# Patient Record
Sex: Female | Born: 1985 | Race: White | Hispanic: No | Marital: Married | Smoking: Never smoker
Health system: Southern US, Community
[De-identification: ages and names within clinical notes are randomized; demographics above are authoritative.]

## PROBLEM LIST (undated history)

## (undated) DIAGNOSIS — J8283 Eosinophilic asthma: Secondary | ICD-10-CM

## (undated) DIAGNOSIS — O24419 Gestational diabetes mellitus in pregnancy, unspecified control: Secondary | ICD-10-CM

## (undated) DIAGNOSIS — Z1371 Encounter for nonprocreative screening for genetic disease carrier status: Secondary | ICD-10-CM

## (undated) HISTORY — DX: Gestational diabetes mellitus in pregnancy, unspecified control: O24.419

## (undated) HISTORY — DX: Encounter for nonprocreative screening for genetic disease carrier status: Z13.71

## (undated) HISTORY — PX: COLPOSCOPY: SHX161

## (undated) HISTORY — DX: Eosinophilic asthma: J82.83

---

## 2008-09-01 ENCOUNTER — Ambulatory Visit: Payer: Self-pay | Admitting: Internal Medicine

## 2011-07-09 ENCOUNTER — Ambulatory Visit: Payer: Self-pay | Admitting: Internal Medicine

## 2011-11-30 ENCOUNTER — Ambulatory Visit: Payer: Self-pay | Admitting: Gastroenterology

## 2011-11-30 LAB — PREGNANCY, URINE: Pregnancy Test, Urine: NEGATIVE m[IU]/mL

## 2012-08-10 DIAGNOSIS — Z1371 Encounter for nonprocreative screening for genetic disease carrier status: Secondary | ICD-10-CM

## 2012-08-10 HISTORY — DX: Encounter for nonprocreative screening for genetic disease carrier status: Z13.71

## 2016-08-10 HISTORY — PX: COLPOSCOPY: SHX161

## 2017-08-12 ENCOUNTER — Encounter: Payer: Self-pay | Admitting: Obstetrics and Gynecology

## 2017-08-12 ENCOUNTER — Ambulatory Visit (INDEPENDENT_AMBULATORY_CARE_PROVIDER_SITE_OTHER): Payer: BC Managed Care – PPO | Admitting: Obstetrics and Gynecology

## 2017-08-12 ENCOUNTER — Other Ambulatory Visit: Payer: Self-pay

## 2017-08-12 VITALS — BP 110/76 | HR 76 | Ht 64.0 in | Wt 140.0 lb

## 2017-08-12 DIAGNOSIS — Z3041 Encounter for surveillance of contraceptive pills: Secondary | ICD-10-CM

## 2017-08-12 DIAGNOSIS — Z01419 Encounter for gynecological examination (general) (routine) without abnormal findings: Secondary | ICD-10-CM

## 2017-08-12 DIAGNOSIS — Z124 Encounter for screening for malignant neoplasm of cervix: Secondary | ICD-10-CM

## 2017-08-12 DIAGNOSIS — Z1151 Encounter for screening for human papillomavirus (HPV): Secondary | ICD-10-CM

## 2017-08-12 MED ORDER — DROSPIRENONE-ETHINYL ESTRADIOL 3-0.02 MG PO TABS: 1.0000 | ORAL_TABLET | Freq: Every day | ORAL | 4 refills | Status: DC

## 2017-08-12 NOTE — Patient Instructions (Signed)
I value your feedback and entrusting us with your care. If you get a [facility] patient survey, I would appreciate you taking the time to let us know about your experience today. Thank you! 

## 2017-08-12 NOTE — Progress Notes (Signed)
PCP:  Patient, No Pcp Per   Chief Complaint  Patient presents with  . Gynecologic Exam    No complaints     HPI:      [name] is a 32 y.o. G0P0000 who LMP was No LMP recorded. Patient is not currently having periods (Reason: Oral contraceptives)., presents today for her annual examination.  Her menses are absent due to continuous dosing of OCPs.  Dysmenorrhea mild, occurring throughout cycle. She does not have intermenstrual bleeding.  Sex activity: single partner, contraception - OCP (estrogen/progesterone).  Last Pap: August 11, 2016  Results were: ASCUS/neg HPV DNA Hx of STDs: HPV  There is a  FH of breast cancer in her MGM and PGM. Pt had neg BRCA testing in 2014 but then discovered she doesn't qualify for genetic testing due to true age of dx of breast cancer in grandmother. There is no FH of ovarian cancer. The patient does do self-breast exams.  Tobacco use: The patient denies current or previous tobacco use. Alcohol use: social drinker No drug use.  Exercise: not active  She does get adequate calcium and Vitamin D in her diet.   Past Medical History:  Diagnosis Date  . BRCA negative 2014   BRCA neg    Past Surgical History:  Procedure Laterality Date  . COLPOSCOPY      Family History  Problem Relation Age of Onset  . Hypertension Father   . Breast cancer Maternal Grandmother 60  . Hypertension Maternal Grandmother   . Hypertension Maternal Grandfather   . Breast cancer Paternal Grandmother 23  . Hypertension Paternal Grandmother   . Lung cancer Paternal Grandfather 54  . Hypertension Paternal Grandfather     Social History   Socioeconomic History  . Marital status: Single    Spouse name: Not on file  . Number of children: Not on file  . Years of education: Not on file  . Highest education level: Not on file  Social Needs  . Financial resource strain: Not on file  . Food insecurity - worry: Not on file  . Food insecurity -  inability: Not on file  . Transportation needs - medical: Not on file  . Transportation needs - non-medical: Not on file  Occupational History  . Not on file  Tobacco Use  . Smoking status: Never Smoker  . Smokeless tobacco: Never Used  Substance and Sexual Activity  . Alcohol use: Yes    Alcohol/week: 1.2 oz    Types: 1 Glasses of wine, 1 Cans of beer per week  . Drug use: No  . Sexual activity: Yes    Partners: Male    Birth control/protection: Pill  Other Topics Concern  . Not on file  Social History Narrative  . Not on file    Current Meds  Medication Sig  . drospirenone-ethinyl estradiol (NIKKI) 3-0.02 MG tablet Take 1 tablet by mouth daily. CONTINUOUS DOSING  . [DISCONTINUED] drospirenone-ethinyl estradiol (NIKKI) 3-0.02 MG tablet      ROS:  Review of Systems  Constitutional: Negative for fatigue, fever and unexpected weight change.  Respiratory: Negative for cough, shortness of breath and wheezing.   Cardiovascular: Negative for chest pain, palpitations and leg swelling.  Gastrointestinal: Negative for blood in stool, constipation, diarrhea, nausea and vomiting.  Endocrine: Negative for cold intolerance, heat intolerance and polyuria.  Genitourinary: Negative for dyspareunia, dysuria, flank pain, frequency, genital sores, hematuria, menstrual problem, pelvic pain, urgency, vaginal bleeding, vaginal discharge and vaginal pain.  Musculoskeletal: Negative for back pain, joint swelling and myalgias.  Skin: Negative for rash.  Neurological: Negative for dizziness, syncope, light-headedness, numbness and headaches.  Hematological: Negative for adenopathy.  Psychiatric/Behavioral: Negative for agitation, confusion, sleep disturbance and suicidal ideas. The patient is not nervous/anxious.      Objective: BP 110/76 (BP Location: Left Arm, Patient Position: Sitting, Cuff Size: Normal)   Pulse 76   Ht 5' 4" (1.626 m)   Wt 140 lb (63.5 kg)   BMI 24.03 kg/m     Physical Exam  Constitutional: She is oriented to person, place, and time. She appears well-developed and well-nourished.  Genitourinary: Vagina normal and uterus normal. There is no rash or tenderness on the right labia. There is no rash or tenderness on the left labia. No erythema or tenderness in the vagina. No vaginal discharge found. Right adnexum does not display mass and does not display tenderness. Left adnexum does not display mass and does not display tenderness. Cervix does not exhibit motion tenderness or polyp. Uterus is not enlarged or tender.  Neck: Normal range of motion. No thyromegaly present.  Cardiovascular: Normal rate, regular rhythm and normal heart sounds.  No murmur heard. Pulmonary/Chest: Effort normal and breath sounds normal. Right breast exhibits no mass, no nipple discharge, no skin change and no tenderness. Left breast exhibits no mass, no nipple discharge, no skin change and no tenderness.  Abdominal: Soft. There is no tenderness. There is no guarding.  Musculoskeletal: Normal range of motion.  Neurological: She is alert and oriented to person, place, and time. No cranial nerve deficit.  Psychiatric: She has a normal mood and affect. Her behavior is normal.  Vitals reviewed.   Assessment/Plan: Encounter for annual routine gynecological examination  Cervical cancer screening - Plan: IGP, Aptima HPV  Screening for HPV (human papillomavirus) - Plan: IGP, Aptima HPV  Encounter for surveillance of contraceptive pills - OCP RF for cont dosing. - Plan: drospirenone-ethinyl estradiol (NIKKI) 3-0.02 MG tablet  Meds ordered this encounter  Medications  . drospirenone-ethinyl estradiol (NIKKI) 3-0.02 MG tablet    Sig: Take 1 tablet by mouth daily. CONTINUOUS DOSING    Dispense:  3 Package    Refill:  4             GYN counsel adequate intake of calcium and vitamin D, diet and exercise     F/U  Return in about 1 year (around 08/12/2018).  Alicia B.  Copland, PA-C 08/12/2017 2:19 PM 

## 2017-08-14 LAB — IGP, APTIMA HPV
HPV Aptima: NEGATIVE
PAP SMEAR COMMENT: 0

## 2018-08-15 ENCOUNTER — Encounter: Payer: Self-pay | Admitting: Obstetrics and Gynecology

## 2018-08-15 ENCOUNTER — Ambulatory Visit (INDEPENDENT_AMBULATORY_CARE_PROVIDER_SITE_OTHER): Payer: BC Managed Care – PPO | Admitting: Obstetrics and Gynecology

## 2018-08-15 VITALS — BP 102/72 | HR 82 | Ht 65.0 in | Wt 143.0 lb

## 2018-08-15 DIAGNOSIS — Z01419 Encounter for gynecological examination (general) (routine) without abnormal findings: Secondary | ICD-10-CM

## 2018-08-15 DIAGNOSIS — N921 Excessive and frequent menstruation with irregular cycle: Secondary | ICD-10-CM

## 2018-08-15 DIAGNOSIS — Z3041 Encounter for surveillance of contraceptive pills: Secondary | ICD-10-CM

## 2018-08-15 MED ORDER — DROSPIRENONE-ETHINYL ESTRADIOL 3-0.02 MG PO TABS: 1.0000 | ORAL_TABLET | Freq: Every day | ORAL | 4 refills | Status: DC

## 2018-08-15 NOTE — Patient Instructions (Signed)
I value your feedback and entrusting us with your care. If you get a [facility] patient survey, I would appreciate you taking the time to let us know about your experience today. Thank you! 

## 2018-08-15 NOTE — Progress Notes (Signed)
PCP:  Patient, No Pcp Per   Chief Complaint  Patient presents with  . Gynecologic Exam    has been cramping for the last month entire pelvic area, woke up to brownish discharge, no odor     HPI:      Ms. Jennifer PICINICH is a 33 y.o. G0P0000 who LMP was No LMP recorded. (Menstrual status: Oral contraceptives)., presents today for her annual examination.  Her menses are absent due to continuous dosing of OCPs.  Dysmenorrhea mild, occurring throughout cycle. She has had intermenstrual bleeding today only.  No late/missed pills.   Sex activity: single partner, contraception - OCP (estrogen/progesterone). No new partners.  Last Pap: 08/12/17  Results were: no abnormalities/neg HPV DNA Hx of STDs: HPV  There is a  FH of breast cancer in her MGM and PGM. Pt had neg BRCA testing in 2014 but then discovered she doesn't qualify for genetic testing due to true age of dx of breast cancer in grandmother. There is no FH of ovarian cancer. The patient does do self-breast exams.  Tobacco use: The patient denies current or previous tobacco use. Alcohol use: social drinker No drug use.  Exercise: not active  She does get adequate calcium but not Vitamin D in her diet.   Past Medical History:  Diagnosis Date  . BRCA negative 2014   BRCA neg    Past Surgical History:  Procedure Laterality Date  . COLPOSCOPY      Family History  Problem Relation Age of Onset  . Hypertension Father   . Breast cancer Maternal Grandmother 60  . Hypertension Maternal Grandmother   . Hypertension Maternal Grandfather   . Breast cancer Paternal Grandmother 42  . Hypertension Paternal Grandmother   . Lung cancer Paternal Grandfather 33  . Hypertension Paternal Grandfather     Social History   Socioeconomic History  . Marital status: Single    Spouse name: Not on file  . Number of children: Not on file  . Years of education: Not on file  . Highest education level: Not on file  Occupational  History  . Not on file  Social Needs  . Financial resource strain: Not on file  . Food insecurity:    Worry: Not on file    Inability: Not on file  . Transportation needs:    Medical: Not on file    Non-medical: Not on file  Tobacco Use  . Smoking status: Never Smoker  . Smokeless tobacco: Never Used  Substance and Sexual Activity  . Alcohol use: Yes    Alcohol/week: 2.0 standard drinks    Types: 1 Glasses of wine, 1 Cans of beer per week    Comment: occ  . Drug use: No  . Sexual activity: Yes    Partners: Male    Birth control/protection: Pill  Lifestyle  . Physical activity:    Days per week: 0 days    Minutes per session: Not on file  . Stress: Not at all  Relationships  . Social connections:    Talks on phone: More than three times a week    Gets together: Three times a week    Attends religious service: More than 4 times per year    Active member of club or organization: No    Attends meetings of clubs or organizations: Never    Relationship status: Never married  . Intimate partner violence:    Fear of current or ex partner: No    Emotionally  abused: No    Physically abused: No    Forced sexual activity: No  Other Topics Concern  . Not on file  Social History Narrative  . Not on file    Current Meds  Medication Sig  . drospirenone-ethinyl estradiol (NIKKI) 3-0.02 MG tablet Take 1 tablet by mouth daily. CONTINUOUS DOSING  . [DISCONTINUED] drospirenone-ethinyl estradiol (NIKKI) 3-0.02 MG tablet Take 1 tablet by mouth daily. CONTINUOUS DOSING     ROS:  Review of Systems  Constitutional: Negative for fatigue, fever and unexpected weight change.  Respiratory: Negative for cough, shortness of breath and wheezing.   Cardiovascular: Negative for chest pain, palpitations and leg swelling.  Gastrointestinal: Negative for blood in stool, constipation, diarrhea, nausea and vomiting.  Endocrine: Negative for cold intolerance, heat intolerance and polyuria.    Genitourinary: Negative for dyspareunia, dysuria, flank pain, frequency, genital sores, hematuria, menstrual problem, pelvic pain, urgency, vaginal bleeding, vaginal discharge and vaginal pain.  Musculoskeletal: Negative for back pain, joint swelling and myalgias.  Skin: Negative for rash.  Neurological: Negative for dizziness, syncope, light-headedness, numbness and headaches.  Hematological: Negative for adenopathy.  Psychiatric/Behavioral: Negative for agitation, confusion, sleep disturbance and suicidal ideas. The patient is not nervous/anxious.      Objective: BP 102/72   Pulse 82   Ht '5\' 5"'  (1.651 m)   Wt 143 lb (64.9 kg)   BMI 23.80 kg/m    Physical Exam Constitutional:      Appearance: She is well-developed.  Genitourinary:     Vulva, vagina, uterus, right adnexa and left adnexa normal.     No vulval lesion or tenderness noted.     No vaginal discharge, erythema or tenderness.     Cervical bleeding present.     No cervical polyp.     Uterus is not enlarged or tender.     No right or left adnexal mass present.     Right adnexa not tender.     Left adnexa not tender.  Neck:     Musculoskeletal: Normal range of motion.     Thyroid: No thyromegaly.  Cardiovascular:     Rate and Rhythm: Normal rate and regular rhythm.     Heart sounds: Normal heart sounds. No murmur.  Pulmonary:     Effort: Pulmonary effort is normal.     Breath sounds: Normal breath sounds.  Chest:     Breasts:        Right: No mass, nipple discharge, skin change or tenderness.        Left: No mass, nipple discharge, skin change or tenderness.  Abdominal:     Palpations: Abdomen is soft.     Tenderness: There is no abdominal tenderness. There is no guarding.  Musculoskeletal: Normal range of motion.  Neurological:     Mental Status: She is alert and oriented to person, place, and time.     Cranial Nerves: No cranial nerve deficit.  Psychiatric:        Behavior: Behavior normal.  Vitals signs  reviewed.     Assessment/Plan: Encounter for annual routine gynecological examination  Encounter for surveillance of contraceptive pills - OCP RF - Plan: drospirenone-ethinyl estradiol (NIKKI) 3-0.02 MG tablet  Breakthrough bleeding on OCPs - Today, due to cont dosing of OCPs. Can do placebo wk if sx persist.   Meds ordered this encounter  Medications  . drospirenone-ethinyl estradiol (NIKKI) 3-0.02 MG tablet    Sig: Take 1 tablet by mouth daily. CONTINUOUS DOSING    Dispense:  3  Package    Refill:  4    Order Specific Question:   Supervising Provider    Answer:   Gae Dry [[id number]]             GYN counsel adequate intake of calcium and vitamin D, diet and exercise     F/U  Return in about 1 year (around 08/16/2019).  Alicia B. Copland, PA-C 08/15/2018 1:48 PM

## 2019-08-15 NOTE — Patient Instructions (Signed)
I value your feedback and entrusting us with your care. If you get a [facility] patient survey, I would appreciate you taking the time to let us know about your experience today. Thank you!  As of July 20, 2019, your lab results will be released to your MyChart immediately, before I even have a chance to see them. Please give me time to review them and contact you if there are any abnormalities. Thank you for your patience.  

## 2019-08-15 NOTE — Progress Notes (Signed)
PCP:  Patient, No Pcp Per   Chief Complaint  Patient presents with  . Gynecologic Exam     HPI:      [name] is a 34 y.o. G0P0000 who LMP was No LMP recorded. (Menstrual status: Oral contraceptives)., presents today for her annual examination.  Her menses are absent due to continuous dosing of OCPs.  Dysmenorrhea none, no BTB.   Sex activity: single partner, contraception - OCP (estrogen/progesterone).  Last Pap: 08/12/17  Results were: no abnormalities/neg HPV DNA Hx of STDs: HPV  There is a  FH of breast cancer in her MGM and PGM. Pt had neg BRCA testing in 2014 but then discovered she doesn't qualify for genetic testing due to true age of dx of breast cancer in grandmother. There is no FH of ovarian cancer. The patient does do self-breast exams.  Tobacco use: The patient denies current or previous tobacco use. Alcohol use: social drinker No drug use.  Exercise: mod active  She does get adequate calcium but not Vitamin D in her diet.   Past Medical History:  Diagnosis Date  . BRCA negative 2014   BRCA neg    Past Surgical History:  Procedure Laterality Date  . COLPOSCOPY      Family History  Problem Relation Age of Onset  . Hypertension Father   . Breast cancer Maternal Grandmother 60  . Hypertension Maternal Grandmother   . Hypertension Maternal Grandfather   . Breast cancer Paternal Grandmother 63  . Hypertension Paternal Grandmother   . Lung cancer Paternal Grandfather 13  . Hypertension Paternal Grandfather   . Heart Problems Mother     Social History   Socioeconomic History  . Marital status: Single    Spouse name: Not on file  . Number of children: Not on file  . Years of education: Not on file  . Highest education level: Not on file  Occupational History  . Not on file  Tobacco Use  . Smoking status: Never Smoker  . Smokeless tobacco: Never Used  Substance and Sexual Activity  . Alcohol use: Yes    Alcohol/week: 2.0  standard drinks    Types: 1 Glasses of wine, 1 Cans of beer per week    Comment: occ  . Drug use: No  . Sexual activity: Yes    Partners: Male    Birth control/protection: Pill  Other Topics Concern  . Not on file  Social History Narrative  . Not on file   Social Determinants of Health   Financial Resource Strain:   . Difficulty of Paying Living Expenses: Not on file  Food Insecurity:   . Worried About Charity fundraiser in the Last Year: Not on file  . Ran Out of Food in the Last Year: Not on file  Transportation Needs:   . Lack of Transportation (Medical): Not on file  . Lack of Transportation (Non-Medical): Not on file  Physical Activity:   . Days of Exercise per Week: Not on file  . Minutes of Exercise per Session: Not on file  Stress:   . Feeling of Stress : Not on file  Social Connections:   . Frequency of Communication with Friends and Family: Not on file  . Frequency of Social Gatherings with Friends and Family: Not on file  . Attends Religious Services: Not on file  . Active Member of Clubs or Organizations: Not on file  . Attends Archivist Meetings: Not on file  .  Marital Status: Not on file  Intimate Partner Violence:   . Fear of Current or Ex-Partner: Not on file  . Emotionally Abused: Not on file  . Physically Abused: Not on file  . Sexually Abused: Not on file    Current Meds  Medication Sig  . drospirenone-ethinyl estradiol (NIKKI) 3-0.02 MG tablet Take 1 tablet by mouth daily. CONTINUOUS DOSING  . [DISCONTINUED] drospirenone-ethinyl estradiol (NIKKI) 3-0.02 MG tablet Take 1 tablet by mouth daily. CONTINUOUS DOSING     ROS:  Review of Systems  Constitutional: Negative for fatigue, fever and unexpected weight change.  Respiratory: Negative for cough, shortness of breath and wheezing.   Cardiovascular: Negative for chest pain, palpitations and leg swelling.  Gastrointestinal: Negative for blood in stool, constipation, diarrhea, nausea and  vomiting.  Endocrine: Negative for cold intolerance, heat intolerance and polyuria.  Genitourinary: Negative for dyspareunia, dysuria, flank pain, frequency, genital sores, hematuria, menstrual problem, pelvic pain, urgency, vaginal bleeding, vaginal discharge and vaginal pain.  Musculoskeletal: Negative for back pain, joint swelling and myalgias.  Skin: Negative for rash.  Neurological: Negative for dizziness, syncope, light-headedness, numbness and headaches.  Hematological: Negative for adenopathy.  Psychiatric/Behavioral: Negative for agitation, confusion, sleep disturbance and suicidal ideas. The patient is not nervous/anxious.      Objective: BP 104/60   Ht '5\' 5"'  (1.651 m)   Wt 142 lb (64.4 kg)   BMI 23.63 kg/m    Physical Exam Constitutional:      Appearance: She is well-developed.  Genitourinary:     Vulva, vagina, uterus, right adnexa and left adnexa normal.     No vulval lesion or tenderness noted.     No vaginal discharge, erythema or tenderness.     Cervical bleeding present.     No cervical polyp.     Uterus is not enlarged or tender.     No right or left adnexal mass present.     Right adnexa not tender.     Left adnexa not tender.  Neck:     Thyroid: No thyromegaly.  Cardiovascular:     Rate and Rhythm: Normal rate and regular rhythm.     Heart sounds: Normal heart sounds. No murmur.  Pulmonary:     Effort: Pulmonary effort is normal.     Breath sounds: Normal breath sounds.  Chest:     Breasts:        Right: No mass, nipple discharge, skin change or tenderness.        Left: No mass, nipple discharge, skin change or tenderness.  Abdominal:     Palpations: Abdomen is soft.     Tenderness: There is no abdominal tenderness. There is no guarding.  Musculoskeletal:        General: Normal range of motion.     Cervical back: Normal range of motion.  Neurological:     General: No focal deficit present.     Mental Status: She is alert and oriented to person,  place, and time.     Cranial Nerves: No cranial nerve deficit.  Skin:    General: Skin is warm and dry.  Psychiatric:        Mood and Affect: Mood normal.        Behavior: Behavior normal.        Thought Content: Thought content normal.        Judgment: Judgment normal.  Vitals reviewed.     Assessment/Plan: Encounter for annual routine gynecological examination  Encounter for surveillance of contraceptive pills -  OCP RF - Plan: drospirenone-ethinyl estradiol (NIKKI) 3-0.02 MG tablet  Meds ordered this encounter  Medications  . drospirenone-ethinyl estradiol (NIKKI) 3-0.02 MG tablet    Sig: Take 1 tablet by mouth daily. CONTINUOUS DOSING    Dispense:  3 Package    Refill:  4    Order Specific Question:   Supervising Provider    Answer:   Gae Dry [[id number]]             GYN counsel adequate intake of calcium and vitamin D, diet and exercise     F/U  Return in about 1 year (around 08/16/2020).  Jmarion Christiano B. Milee Qualls, PA-C 08/17/2019 11:27 AM

## 2019-08-17 ENCOUNTER — Ambulatory Visit (INDEPENDENT_AMBULATORY_CARE_PROVIDER_SITE_OTHER): Payer: BC Managed Care – PPO | Admitting: Obstetrics and Gynecology

## 2019-08-17 ENCOUNTER — Other Ambulatory Visit: Payer: Self-pay

## 2019-08-17 ENCOUNTER — Encounter: Payer: Self-pay | Admitting: Obstetrics and Gynecology

## 2019-08-17 VITALS — BP 104/60 | Ht 65.0 in | Wt 142.0 lb

## 2019-08-17 DIAGNOSIS — Z3041 Encounter for surveillance of contraceptive pills: Secondary | ICD-10-CM

## 2019-08-17 DIAGNOSIS — Z01419 Encounter for gynecological examination (general) (routine) without abnormal findings: Secondary | ICD-10-CM | POA: Diagnosis not present

## 2019-08-17 MED ORDER — DROSPIRENONE-ETHINYL ESTRADIOL 3-0.02 MG PO TABS
1.0000 | ORAL_TABLET | Freq: Every day | ORAL | 4 refills | Status: DC
Start: 2019-08-17 — End: 2020-12-09

## 2019-10-09 DIAGNOSIS — U071 COVID-19: Secondary | ICD-10-CM

## 2019-10-09 HISTORY — DX: COVID-19: U07.1

## 2020-07-22 ENCOUNTER — Encounter: Payer: Self-pay | Admitting: Family Medicine

## 2020-07-22 ENCOUNTER — Ambulatory Visit (INDEPENDENT_AMBULATORY_CARE_PROVIDER_SITE_OTHER): Payer: BC Managed Care – PPO

## 2020-07-22 ENCOUNTER — Ambulatory Visit
Admission: EM | Admit: 2020-07-22 | Discharge: 2020-07-22 | Disposition: A | Discharge status: Home / Self Care | Payer: BC Managed Care – PPO

## 2020-07-22 ENCOUNTER — Ambulatory Visit
Admission: EM | Admit: 2020-07-22 | Discharge: 2020-07-22 | Disposition: A | Discharge status: Home / Self Care | Payer: Self-pay

## 2020-07-22 DIAGNOSIS — R0781 Pleurodynia: Secondary | ICD-10-CM

## 2020-07-22 NOTE — ED Provider Notes (Signed)
Roderic Palau    CSN: [id number] Arrival date & time: 07/22/20  1355      History   Chief Complaint Chief Complaint  Patient presents with  . R side pain    HPI Nayla Dias Hillyard is a 34 y.o. female.   Patient is a 34 year old female who presents today with right sided rib pain.  This is worse with coughing, deep breathing.  Started approximately 3 weeks ago after she had a coughing spell and felt a pop in the rib area.  No fevers or shortness of breath.  Worse when laying on the right side.     Past Medical History:  Diagnosis Date  . BRCA negative 2014   BRCA neg  . COVID 10/2019    There are no problems to display for this patient.   Past Surgical History:  Procedure Laterality Date  . COLPOSCOPY      OB History    Gravida  1   Para  0   Term  0   Preterm  0   AB  1   Living        SAB  0   IAB  1   Ectopic  0   Multiple      Live Births               Home Medications    Prior to Admission medications   Medication Sig Start Date End Date Taking? Authorizing Provider  drospirenone-ethinyl estradiol (NIKKI) 3-0.02 MG tablet Take 1 tablet by mouth daily. CONTINUOUS DOSING 11/14/40   Copland, Elmo Putt B, PA-C  ibuprofen (ADVIL) 200 MG tablet Take 200 mg by mouth every 6 (six) hours as needed.    [provider]    Family History Family History  Problem Relation Age of Onset  . Hypertension Father   . Breast cancer Maternal Grandmother 60  . Hypertension Maternal Grandmother   . Hypertension Maternal Grandfather   . Breast cancer Paternal Grandmother 44  . Hypertension Paternal Grandmother   . Lung cancer Paternal Grandfather 46  . Hypertension Paternal Grandfather   . Heart Problems Mother     Social History Social History   Tobacco Use  . Smoking status: Never Smoker  . Smokeless tobacco: Never Used  Vaping Use  . Vaping Use: Never used  Substance Use Topics  . Alcohol use: Yes    Alcohol/week: 2.0  standard drinks    Types: 1 Glasses of wine, 1 Cans of beer per week    Comment: occ  . Drug use: No     Allergies   Corn oil   Review of Systems Review of Systems   Physical Exam Triage Vital Signs ED Triage Vitals  Enc Vitals Group     BP 07/22/20 1413 126/85     Pulse Rate 07/22/20 1413 85     Resp 07/22/20 1413 14     Temp 07/22/20 1413 99.7 F (37.6 C)     Temp Source 07/22/20 1413 Oral     SpO2 07/22/20 1413 98 %     Weight 07/22/20 1418 150 lb (68 kg)     Height 07/22/20 1418 '5\' 5"'  (1.651 m)     Head Circumference --      Peak Flow --      Pain Score 07/22/20 1417 2     Pain Loc --      Pain Edu? --      Excl. in GC? --    No  data found.  Updated Vital Signs BP 126/85 (BP Location: Left Arm)   Pulse 85   Temp 99.7 F (37.6 C) (Oral)   Resp 14   Ht '5\' 5"'  (1.651 m)   Wt 150 lb (68 kg)   LMP 06/20/2020 (Approximate)   SpO2 98%   BMI 24.96 kg/m   Visual Acuity Right Eye Distance:   Left Eye Distance:   Bilateral Distance:    Right Eye Near:   Left Eye Near:    Bilateral Near:     Physical Exam Vitals and nursing note reviewed.  Constitutional:      General: She is not in acute distress.    Appearance: Normal appearance. She is not ill-appearing, toxic-appearing or diaphoretic.  HENT:     Head: Normocephalic.     Nose: Nose normal.  Eyes:     Conjunctiva/sclera: Conjunctivae normal.  Cardiovascular:     Rate and Rhythm: Normal rate and regular rhythm.  Pulmonary:     Effort: Pulmonary effort is normal.     Breath sounds: Normal breath sounds.  Chest:     Chest wall: No crepitus.       Comments: Area of pain. Pain patch in place.  Musculoskeletal:        General: Normal range of motion.     Cervical back: Normal range of motion.  Skin:    General: Skin is warm and dry.     Findings: No rash.  Neurological:     Mental Status: She is alert.  Psychiatric:        Mood and Affect: Mood normal.      UC Treatments / Results   Labs (all labs ordered are listed, but only abnormal results are displayed) Labs Reviewed - No data to display  EKG   Radiology DG Ribs Unilateral W/Chest Right  Result Date: 07/22/2020 CLINICAL DATA:  Right-sided rib pain since coughing spell 3 weeks ago. EXAM: RIGHT RIBS AND CHEST - 3+ VIEW COMPARISON:  None. FINDINGS: Normal cardiac silhouette and mediastinal contours. No focal parenchymal opacities. No pleural effusion or pneumothorax. No evidence of edema. No definite displaced right-sided rib fractures with special attention paid to the area demarcated by the radiopaque BB. Regional soft tissues appear normal. IMPRESSION: 1. No acute cardiopulmonary disease. 2. No definite displaced right-sided rib fractures special attention paid to the area demarcated by the radiopaque BB. Electronically Signed   By: Sandi Mariscal M.D.   On: 07/22/2020 14:48    Procedures Procedures (including critical care time)  Medications Ordered in UC Medications - No data to display  Initial Impression / Assessment and Plan / UC Course  I have reviewed the triage vital signs and the nursing notes.  Pertinent labs & imaging results that were available during my care of the patient were reviewed by me and considered in my medical decision making (see chart for details).     Rib pain X-ray with no acute findings.  No definite displaced right-sided rib fractures Is likely just lingering inflammation.  Recommended ibuprofen every 8 hours for the next week. Follow up as needed for continued or worsening symptoms  Final Clinical Impressions(s) / UC Diagnoses   Final diagnoses:  Pain in rib     Discharge Instructions     Your x-ray was normal This is most likely just inflammation of the cartilage of your ribs versus musculoskeletal. You can do ibuprofen or Tylenol as needed. Follow up as needed for continued or worsening symptoms  ED Prescriptions    None     PDMP not reviewed this  encounter.   Orvan July, NP 07/23/20 [phone]

## 2020-07-22 NOTE — ED Triage Notes (Signed)
Pt presents to Urgent Care with c/o R side/rib pain after coughing spell three weeks ago. States she felt a "pop" there.

## 2020-07-22 NOTE — Discharge Instructions (Addendum)
Your x-ray was normal This is most likely just inflammation of the cartilage of your ribs versus musculoskeletal. You can do ibuprofen or Tylenol as needed. Follow up as needed for continued or worsening symptoms

## 2020-07-23 ENCOUNTER — Encounter: Payer: Self-pay | Admitting: Family Medicine

## 2020-11-13 ENCOUNTER — Ambulatory Visit: Payer: BC Managed Care – PPO | Admitting: Obstetrics and Gynecology

## 2020-11-13 NOTE — Patient Instructions (Incomplete)
I value your feedback and you entrusting us with your care. If you get a [facility] patient survey, I would appreciate you taking the time to let us know about your experience today. Thank you! ? ? ?

## 2020-11-13 NOTE — Progress Notes (Deleted)
PCP:  Patient, No Pcp Per (Inactive)   No chief complaint on file.    HPI:      [name] is a 35 y.o. G0P0000 who LMP was No LMP recorded. (Menstrual status: Oral contraceptives)., presents today for her annual examination.  Her menses are absent due to continuous dosing of OCPs.  Dysmenorrhea none, no BTB.   Sex activity: single partner, contraception - OCP (estrogen/progesterone).  Last Pap: 08/12/17  Results were: no abnormalities/neg HPV DNA Hx of STDs: HPV  There is a  FH of breast cancer in her MGM and PGM. Pt had neg BRCA testing in 2014 but then discovered she doesn't qualify for genetic testing due to true age of dx of breast cancer in grandmother. There is no FH of ovarian cancer. The patient does do self-breast exams.  Tobacco use: The patient denies current or previous tobacco use. Alcohol use: social drinker No drug use.  Exercise: mod active  She does get adequate calcium but not Vitamin D in her diet.   Past Medical History:  Diagnosis Date  . BRCA negative 2014   BRCA neg  . COVID 10/2019    Past Surgical History:  Procedure Laterality Date  . COLPOSCOPY      Family History  Problem Relation Age of Onset  . Hypertension Father   . Breast cancer Maternal Grandmother 60  . Hypertension Maternal Grandmother   . Hypertension Maternal Grandfather   . Breast cancer Paternal Grandmother 49  . Hypertension Paternal Grandmother   . Lung cancer Paternal Grandfather 66  . Hypertension Paternal Grandfather   . Heart Problems Mother     Social History   Socioeconomic History  . Marital status: Unknown    Spouse name: Not on file  . Number of children: Not on file  . Years of education: Not on file  . Highest education level: Not on file  Occupational History  . Not on file  Tobacco Use  . Smoking status: Never Smoker  . Smokeless tobacco: Never Used  Vaping Use  . Vaping Use: Never used  Substance and Sexual Activity  . Alcohol use: Yes     Alcohol/week: 2.0 standard drinks    Types: 1 Glasses of wine, 1 Cans of beer per week    Comment: occ  . Drug use: No  . Sexual activity: Yes    Partners: Male    Birth control/protection: Pill  Other Topics Concern  . Not on file  Social History Narrative   ** Merged History Encounter **       Social Determinants of Health   Financial Resource Strain: Not on file  Food Insecurity: Not on file  Transportation Needs: Not on file  Physical Activity: Not on file  Stress: Not on file  Social Connections: Not on file  Intimate Partner Violence: Not on file    No outpatient medications have been marked as taking for the 11/13/20 encounter (Appointment) with Liala Codispoti, Deirdre Evener, PA-C.     ROS:  Review of Systems  Constitutional: Negative for fatigue, fever and unexpected weight change.  Respiratory: Negative for cough, shortness of breath and wheezing.   Cardiovascular: Negative for chest pain, palpitations and leg swelling.  Gastrointestinal: Negative for blood in stool, constipation, diarrhea, nausea and vomiting.  Endocrine: Negative for cold intolerance, heat intolerance and polyuria.  Genitourinary: Negative for dyspareunia, dysuria, flank pain, frequency, genital sores, hematuria, menstrual problem, pelvic pain, urgency, vaginal bleeding, vaginal discharge and vaginal pain.  Musculoskeletal: Negative for back pain, joint swelling and myalgias.  Skin: Negative for rash.  Neurological: Negative for dizziness, syncope, light-headedness, numbness and headaches.  Hematological: Negative for adenopathy.  Psychiatric/Behavioral: Negative for agitation, confusion, sleep disturbance and suicidal ideas. The patient is not nervous/anxious.      Objective: There were no vitals taken for this visit.   Physical Exam Constitutional:      Appearance: She is well-developed.  Genitourinary:     Vulva normal.     No vaginal discharge, erythema or tenderness.      Right Adnexa:  not tender and no mass present.    Left Adnexa: not tender and no mass present.    No cervical polyp.     Uterus is not enlarged or tender.  Breasts:     Right: No mass, nipple discharge, skin change or tenderness.     Left: No mass, nipple discharge, skin change or tenderness.    Neck:     Thyroid: No thyromegaly.  Cardiovascular:     Rate and Rhythm: Normal rate and regular rhythm.     Heart sounds: Normal heart sounds. No murmur heard.   Pulmonary:     Effort: Pulmonary effort is normal.     Breath sounds: Normal breath sounds.  Abdominal:     Palpations: Abdomen is soft.     Tenderness: There is no abdominal tenderness. There is no guarding.  Musculoskeletal:        General: Normal range of motion.     Cervical back: Normal range of motion.  Neurological:     General: No focal deficit present.     Mental Status: She is alert and oriented to person, place, and time.     Cranial Nerves: No cranial nerve deficit.  Skin:    General: Skin is warm and dry.  Psychiatric:        Mood and Affect: Mood normal.        Behavior: Behavior normal.        Thought Content: Thought content normal.        Judgment: Judgment normal.  Vitals reviewed.     Assessment/Plan: Encounter for annual routine gynecological examination  Encounter for surveillance of contraceptive pills - OCP RF - Plan: drospirenone-ethinyl estradiol (NIKKI) 3-0.02 MG tablet  No orders of the defined types were placed in this encounter.            GYN counsel adequate intake of calcium and vitamin D, diet and exercise     F/U  No follow-ups on file.  Anuoluwapo Mefferd B. Khalil Belote, PA-C 11/13/2020 9:31 AM

## 2020-12-06 NOTE — Patient Instructions (Signed)
I value your feedback and you entrusting us with your care. If you get a [facility] patient survey, I would appreciate you taking the time to let us know about your experience today. Thank you! ? ? ?

## 2020-12-06 NOTE — Progress Notes (Signed)
PCP:  Patient, No Pcp Per (Inactive)   Chief Complaint  Patient presents with  . Gynecologic Exam    Has qs on conceiving soon     HPI:      [name] is a 35 y.o. G0P0000 who LMP was Patient's last menstrual period was 11/13/2020 (exact date)., presents today for her annual examination.  Her menses are monthly, lasting 4-5 days, dysmenorrhea mild, no BTB. Stopped OCPs 12/21 and hasn't conceived yet. Cycle this month will be her 6th off pills. Has had positive urine ovulation pred kits. Takes PNVs off and on. Pt is s/p EAB age 12 with cytotec use and doesn't remember what else. Was early in pregnancy. No hx of PID/STDs. Husband has 43 yo child; no known testicular trauma.  Sex activity: single partner, contraception - none, trying to conceive. Last Pap: 08/12/17  Results were: no abnormalities/neg HPV DNA Hx of STDs: HPV  There is a  FH of breast cancer in her MGM and PGM. Pt had neg BRCA testing in 2014 but then discovered she doesn't qualify for genetic testing due to true age of dx of breast cancer in grandmother. There is no FH of ovarian cancer. The patient does do self-breast exams.  Tobacco use: The patient denies current or previous tobacco use. Alcohol use: none No drug use.  Exercise: mod active  She does get adequate calcium but not Vitamin D in her diet.   Past Medical History:  Diagnosis Date  . BRCA negative 2014   BRCA neg  . COVID 10/2019  . Eosinophilic asthma     Past Surgical History:  Procedure Laterality Date  . COLPOSCOPY      Family History  Problem Relation Age of Onset  . Hypertension Father   . Breast cancer Maternal Grandmother 60  . Hypertension Maternal Grandmother   . Hypertension Maternal Grandfather   . Breast cancer Paternal Grandmother 12  . Hypertension Paternal Grandmother   . Lung cancer Paternal Grandfather 2  . Hypertension Paternal Grandfather   . Heart Problems Mother     Social History   Socioeconomic History   . Marital status: Unknown    Spouse name: Not on file  . Number of children: Not on file  . Years of education: Not on file  . Highest education level: Not on file  Occupational History  . Not on file  Tobacco Use  . Smoking status: Never Smoker  . Smokeless tobacco: Never Used  Vaping Use  . Vaping Use: Never used  Substance and Sexual Activity  . Alcohol use: Yes    Alcohol/week: 2.0 standard drinks    Types: 1 Glasses of wine, 1 Cans of beer per week    Comment: occ  . Drug use: No  . Sexual activity: Yes    Partners: Male    Birth control/protection: None  Other Topics Concern  . Not on file  Social History Narrative   ** Merged History Encounter **       Social Determinants of Health   Financial Resource Strain: Not on file  Food Insecurity: Not on file  Transportation Needs: Not on file  Physical Activity: Not on file  Stress: Not on file  Social Connections: Not on file  Intimate Partner Violence: Not on file    Current Meds  Medication Sig  . EPINEPHrine 0.3 mg/0.3 mL IJ SOAJ injection SMARTSIG:0.3 Milligram(s) IM Once PRN  . FLOVENT DISKUS 250 MCG/BLIST AEPB Inhale into the lungs.  Marland Kitchen  XOLAIR 75 MG/0.5ML prefilled syringe Inject into the skin.     ROS:  Review of Systems  Constitutional: Negative for fatigue, fever and unexpected weight change.  Respiratory: Negative for cough, shortness of breath and wheezing.   Cardiovascular: Negative for chest pain, palpitations and leg swelling.  Gastrointestinal: Negative for blood in stool, constipation, diarrhea, nausea and vomiting.  Endocrine: Negative for cold intolerance, heat intolerance and polyuria.  Genitourinary: Negative for dyspareunia, dysuria, flank pain, frequency, genital sores, hematuria, menstrual problem, pelvic pain, urgency, vaginal bleeding, vaginal discharge and vaginal pain.  Musculoskeletal: Negative for back pain, joint swelling and myalgias.  Skin: Negative for rash.  Neurological:  Negative for dizziness, syncope, light-headedness, numbness and headaches.  Hematological: Negative for adenopathy.  Psychiatric/Behavioral: Negative for agitation, confusion, sleep disturbance and suicidal ideas. The patient is not nervous/anxious.      Objective: BP 100/70   Ht _0  (1.651 m)   Wt 154 lb (69.9 kg)   LMP 11/13/2020 (Exact Date)   BMI 25.63 kg/m    Physical Exam Constitutional:      Appearance: She is well-developed.  Genitourinary:     Vulva normal.     Right Labia: No rash, tenderness or lesions.    Left Labia: No tenderness, lesions or rash.    No vaginal discharge, erythema or tenderness.      Right Adnexa: not tender and no mass present.    Left Adnexa: not tender and no mass present.    No cervical friability or polyp.     Uterus is not enlarged or tender.  Breasts:     Right: No mass, nipple discharge, skin change or tenderness.     Left: No mass, nipple discharge, skin change or tenderness.    Neck:     Thyroid: No thyromegaly.  Cardiovascular:     Rate and Rhythm: Normal rate and regular rhythm.     Heart sounds: Normal heart sounds. No murmur heard.   Pulmonary:     Effort: Pulmonary effort is normal.     Breath sounds: Normal breath sounds.  Abdominal:     Palpations: Abdomen is soft.     Tenderness: There is no abdominal tenderness. There is no guarding or rebound.  Musculoskeletal:        General: Normal range of motion.     Cervical back: Normal range of motion.  Lymphadenopathy:     Cervical: No cervical adenopathy.  Neurological:     General: No focal deficit present.     Mental Status: She is alert and oriented to person, place, and time.     Cranial Nerves: No cranial nerve deficit.  Skin:    General: Skin is warm and dry.  Psychiatric:        Mood and Affect: Mood normal.        Behavior: Behavior normal.        Thought Content: Thought content normal.        Judgment: Judgment normal.  Vitals reviewed.      Assessment/Plan: Encounter for annual routine gynecological examination  Pre-conception counseling--pt with pos urine ovulation pred kits. F/u if no menses this cycle for order for semen analysis. If WNL, will do HSG. Cont PNVs.        GYN counsel adequate intake of calcium and vitamin D, diet and exercise     F/U  Return in about 1 year (around 12/09/2021).  Jaizon Deroos B. Naseem Varden, PA-C 12/09/2020 4:36 PM

## 2020-12-09 ENCOUNTER — Encounter: Payer: Self-pay | Admitting: Obstetrics and Gynecology

## 2020-12-09 ENCOUNTER — Other Ambulatory Visit: Payer: Self-pay

## 2020-12-09 ENCOUNTER — Ambulatory Visit (INDEPENDENT_AMBULATORY_CARE_PROVIDER_SITE_OTHER): Payer: BC Managed Care – PPO | Admitting: Obstetrics and Gynecology

## 2020-12-09 VITALS — BP 100/70 | Ht 65.0 in | Wt 154.0 lb

## 2020-12-09 DIAGNOSIS — Z01419 Encounter for gynecological examination (general) (routine) without abnormal findings: Secondary | ICD-10-CM | POA: Diagnosis not present

## 2020-12-09 DIAGNOSIS — Z3169 Encounter for other general counseling and advice on procreation: Secondary | ICD-10-CM

## 2020-12-09 DIAGNOSIS — Z3041 Encounter for surveillance of contraceptive pills: Secondary | ICD-10-CM

## 2020-12-10 ENCOUNTER — Encounter: Payer: Self-pay | Admitting: Obstetrics and Gynecology

## 2020-12-11 NOTE — Telephone Encounter (Signed)
NOB appt

## 2020-12-17 ENCOUNTER — Telehealth: Payer: Self-pay

## 2020-12-17 NOTE — Telephone Encounter (Signed)
Pt calling; is 5wks preg; saw pulmonologist today; BP was 144/78; should she be concerned?  [phone]  Pt states she bought a BP device and her Bps have been normal since.  Pt reassured.

## 2020-12-18 ENCOUNTER — Telehealth: Payer: Self-pay

## 2020-12-18 NOTE — Telephone Encounter (Signed)
Pt calling; is [redacted]w[redacted]d today; breasts have been tender/sore and has been cramping; today she doesn't feel preg.  Should she come in for a blood test or what to do?  [phone]  Pt states up until about 10:30 this am she didn't feel preg; cramping is on one side; it's the same as ovulation/menstrual cramp; pt states she is worrying herself to death.  Adv pt to take a breath; enjoy being pregnant; cramping is normal as long as it doesn't make her double over or stop her in her tracks; breast tenderness/soreness can come and go.  Can always take another test if needs to see the line again.  Pt states the last one she took the line was a dark as the control line.  Adv is she had miscarried she would be bleeding.  Pt reassured.

## 2021-01-07 ENCOUNTER — Ambulatory Visit (INDEPENDENT_AMBULATORY_CARE_PROVIDER_SITE_OTHER): Payer: BC Managed Care – PPO | Admitting: Obstetrics

## 2021-01-07 ENCOUNTER — Encounter: Payer: Self-pay | Admitting: Obstetrics

## 2021-01-07 ENCOUNTER — Other Ambulatory Visit: Payer: Self-pay

## 2021-01-07 ENCOUNTER — Other Ambulatory Visit (HOSPITAL_COMMUNITY)
Admission: RE | Admit: 2021-01-07 | Discharge: 2021-01-07 | Disposition: A | Discharge status: Home / Self Care | Payer: BC Managed Care – PPO | Source: Ambulatory Visit | Attending: Obstetrics | Admitting: Obstetrics

## 2021-01-07 VITALS — BP 118/70 | Wt 156.8 lb

## 2021-01-07 DIAGNOSIS — Z3480 Encounter for supervision of other normal pregnancy, unspecified trimester: Secondary | ICD-10-CM | POA: Insufficient documentation

## 2021-01-07 DIAGNOSIS — Z3A01 Less than 8 weeks gestation of pregnancy: Secondary | ICD-10-CM | POA: Diagnosis not present

## 2021-01-07 DIAGNOSIS — Z113 Encounter for screening for infections with a predominantly sexual mode of transmission: Secondary | ICD-10-CM | POA: Diagnosis not present

## 2021-01-07 DIAGNOSIS — N926 Irregular menstruation, unspecified: Secondary | ICD-10-CM | POA: Diagnosis not present

## 2021-01-07 LAB — POCT URINALYSIS DIPSTICK OB
Glucose, UA: NEGATIVE
POC,PROTEIN,UA: NEGATIVE

## 2021-01-07 LAB — POCT URINE PREGNANCY: Preg Test, Ur: POSITIVE — AB

## 2021-01-07 NOTE — Progress Notes (Signed)
New Obstetric Patient H&P    Chief Complaint: "Desires prenatal care"   History of Present Illness: Patient is a 35 y.o. G2P0010 Not Hispanic or [place] female, LMP 11/13/2020 presents with amenorrhea and positive home pregnancy test. Based on her  LMP, her EDD is Estimated Date of Delivery: 08/20/21 and her EGA is [redacted]w[redacted]d Cycles are 5. days, regular, and occur approximately every : 28 days. Her last pap smear was a few years ago and was no abnormalities.    She had a urine pregnancy test which was positive approximately 2 week(s)  ago. Her last menstrual period was normal and lasted for  about 5 day(s). Since her LMP she claims she has experienced fatigue, nausea and breast tenderness. She denies vaginal bleeding. Her past medical history is noncontributory. Her prior pregnancies are notable for one elective abortion  Since her LMP, she admits to the use of tobacco products  no She claims she has gained   no pounds since the start of her pregnancy.  There are cats in the home in the home  no She admits close contact with children on a regular basis  yes  She has had chicken pox in the past yes She has had Tuberculosis exposures, symptoms, or previously tested positive for TB   no Current or past history of domestic violence. no  Genetic Screening/Teratology Counseling: (Includes patient, baby's father, or anyone in either family with:)   [facility] 2. Thalassemia ([place] or [place] background): MCV<80  no 3. Neural tube defect (meningomyelocele, spina bifida, anencephaly)  no 4. Congenital heart defect  no  5. Down syndrome  no 6. Tay-Sachs ([place], [place]  no 7. Canavan's Disease  no 8. Sickle cell disease or trait (African)  no  9. Hemophilia or other blood disorders  no  10. Muscular dystrophy  no  11. Cystic fibrosis  no  12. Huntington's Chorea  no  13. Mental retardation/autism  no 14. Other inherited genetic or  chromosomal disorder  no 15. Maternal metabolic disorder (DM, PKU, etc)  no 16. Patient or FOB with a child with a birth defect not listed above no  16a. Patient or FOB with a birth defect themselves no 17. Recurrent pregnancy loss, or stillbirth  no  18. Any medications since LMP other than prenatal vitamins (include vitamins, supplements, OTC meds, drugs, alcohol)  no 19. Any other genetic/environmental exposure to discuss  no  Infection History:   1. Lives with someone with TB or TB exposed  no  2. Patient or partner has history of genital herpes  no 3. Rash or viral illness since LMP  no 4. History of STI (GC, CT, HPV, syphilis, HIV)  no 5. History of recent travel :  no  Other pertinent information:  no     Review of Systems:10 point review of systems negative unless otherwise noted in HPI  Past Medical History:  Past Medical History:  Diagnosis Date  . BRCA negative 2014   BRCA neg  . COVID 10/2019  . Eosinophilic asthma     Past Surgical History:  Past Surgical History:  Procedure Laterality Date  . COLPOSCOPY      Gynecologic History: Patient's last menstrual period was 11/13/2020 (exact date).  Obstetric History: G2P0010  Family History:  Family History  Problem Relation Age of Onset  . Hypertension Father   . Breast cancer Maternal Grandmother 60  . Hypertension Maternal  Grandmother   . Hypertension Maternal Grandfather   . Breast cancer Paternal Grandmother 29  . Hypertension Paternal Grandmother   . Lung cancer Paternal Grandfather 65  . Hypertension Paternal Grandfather   . Heart Problems Mother     Social History:  Social History   Socioeconomic History  . Marital status: Unknown    Spouse name: Not on file  . Number of children: Not on file  . Years of education: Not on file  . Highest education level: Not on file  Occupational History  . Not on file  Tobacco Use  . Smoking status: Never Smoker  . Smokeless tobacco: Never Used   Vaping Use  . Vaping Use: Never used  Substance and Sexual Activity  . Alcohol use: Yes    Alcohol/week: 2.0 standard drinks    Types: 1 Glasses of wine, 1 Cans of beer per week    Comment: occ  . Drug use: No  . Sexual activity: Yes    Partners: Male    Birth control/protection: None  Other Topics Concern  . Not on file  Social History Narrative   ** Merged History Encounter **       Social Determinants of Health   Financial Resource Strain: Not on file  Food Insecurity: Not on file  Transportation Needs: Not on file  Physical Activity: Not on file  Stress: Not on file  Social Connections: Not on file  Intimate Partner Violence: Not on file    Allergies:  Allergies  Allergen Reactions  . Corn Oil Rash    Medications: Prior to Admission medications   Medication Sig Start Date End Date Taking? Authorizing Provider  EPINEPHrine 0.3 mg/0.3 mL IJ SOAJ injection SMARTSIG:0.3 Milligram(s) IM Once PRN 11/06/20  Yes [provider]  FLOVENT DISKUS 250 MCG/BLIST AEPB Inhale into the lungs. 12/04/20  Yes [provider]  Arvid Right 75 MG/0.5ML prefilled syringe Inject into the skin. 11/27/20  Yes [provider]    Physical Exam Vitals: Blood pressure 118/70, weight 156 lb 12.8 oz (71.1 kg), last menstrual period 11/13/2020.  General: NAD HEENT: normocephalic, anicteric Thyroid: no enlargement, no palpable nodules Pulmonary: No increased work of breathing, CTAB Cardiovascular: RRR, distal pulses 2+ Abdomen: NABS, soft, non-tender, non-distended.  Umbilicus without lesions.  No hepatomegaly, splenomegaly or masses palpable. No evidence of hernia  Genitourinary:  External: Normal external female genitalia.  Normal urethral meatus, normal  Bartholin's and Skene's glands.    Vagina: Normal vaginal mucosa, no evidence of prolapse.    Cervix: Grossly normal in appearance, no bleeding  Uterus: anteverted, Non-enlarged, mobile, normal contour.  No  CMT  Adnexa: ovaries non-enlarged, no adnexal masses  Rectal: deferred Extremities: no edema, erythema, or tenderness Neurologic: Grossly intact Psychiatric: mood appropriate, affect full   Assessment: 35 y.o. G2P0010 at 62w6dpresenting to initiate prenatal care  Plan: 1) Avoid alcoholic beverages. 2) Patient encouraged not to smoke.  3) Discontinue the use of all non-medicinal drugs and chemicals.  4) Take prenatal vitamins daily.  5) Nutrition, food safety (fish, cheese advisories, and high nitrite foods) and exercise discussed. 6) Hospital and practice style discussed with cross coverage system.  7) Genetic Screening, such as with 1st Trimester Screening, cell free fetal DNA, AFP testing, and Ultrasound, as well as with amniocentesis and CVS as appropriate, is discussed with patient. At the conclusion of today's visit patient requested [facility] testing. [facility] and may want to further discuss other gnetic testing at her  next appointment. 8) Patient is asked about travel to areas at risk for the Zika virus, and counseled to avoid travel and exposure to mosquitoes or sexual partners who may have themselves been exposed to the virus. Testing is discussed, and will be ordered as appropriate.   RTC in several weeks for blood work and a dating scan in the office with an MD.  Imagene Riches, [place]  01/07/2021 6:31 PM

## 2021-01-09 LAB — CYTOLOGY - PAP
Chlamydia: NEGATIVE
Comment: NEGATIVE
Comment: NEGATIVE
Comment: NORMAL
Diagnosis: NEGATIVE
Neisseria Gonorrhea: NEGATIVE
Trichomonas: NEGATIVE

## 2021-01-31 ENCOUNTER — Ambulatory Visit (INDEPENDENT_AMBULATORY_CARE_PROVIDER_SITE_OTHER): Payer: BC Managed Care – PPO | Admitting: Obstetrics and Gynecology

## 2021-01-31 ENCOUNTER — Encounter: Payer: Self-pay | Admitting: Obstetrics and Gynecology

## 2021-01-31 ENCOUNTER — Other Ambulatory Visit: Payer: Self-pay

## 2021-01-31 VITALS — BP 110/70 | Wt 158.0 lb

## 2021-01-31 DIAGNOSIS — Z3A11 11 weeks gestation of pregnancy: Secondary | ICD-10-CM | POA: Diagnosis not present

## 2021-01-31 DIAGNOSIS — Z3481 Encounter for supervision of other normal pregnancy, first trimester: Secondary | ICD-10-CM

## 2021-01-31 DIAGNOSIS — Z1379 Encounter for other screening for genetic and chromosomal anomalies: Secondary | ICD-10-CM

## 2021-01-31 LAB — POCT URINALYSIS DIPSTICK OB
Glucose, UA: NEGATIVE
POC,PROTEIN,UA: NEGATIVE

## 2021-01-31 NOTE — Progress Notes (Signed)
ULTRASOUND REPORT  Location: [facility] Date of Service: 01/31/2021   Indications:dating, transabdominal  Findings:  Singleton intrauterine pregnancy is visualized with a CRL consistent with [redacted]w[redacted]d gestation, giving an (U/S) EDD of 08/21/2021. The (U/S) EDD is consistent with the clinically established EDD of 08/20/2021.  FHR: 164 BPM CRL measurement: 4.66 cm Yolk sac is visualized and appears normal. Amnion: visualized and appears normal   Right Ovary is normal in appearance. Left Ovary is not normal appearance. Corpus luteal cyst:  Right ovary Survey of the adnexa demonstrates no adnexal masses. There is no free peritoneal fluid in the cul de sac.  Impression: 1. [redacted]w[redacted]d Viable Singleton Intrauterine pregnancy by U/S. 2. (U/S) EDD is consistent with Clinically established EDD of 08/20/2021.  There is a viable singleton gestation.  Detailed evaluation of the fetal anatomy is precluded by early gestational age.  It must be noted that a normal ultrasound particular at this early gestational age is unable to rule out fetal aneuploidy, risk of first trimester miscarriage, or anatomic birth defects.  I personally performed the transabdominal ultrasound and interpreted the images.   Thomasene Mohair, MD, [facility], [facility] 01/31/2021 3:00 PM

## 2021-01-31 NOTE — Addendum Note (Signed)
Addended by: Thomasene Mohair D on: 01/31/2021 04:12 PM   Modules accepted: Orders

## 2021-01-31 NOTE — Progress Notes (Signed)
Routine Prenatal Care Visit  Subjective  Jennifer Pearson is a 35 y.o. G2P0010 at [redacted]w[redacted]d being seen today for ongoing prenatal care.  She is currently monitored for the following issues for this low-risk pregnancy and has Supervision of other normal pregnancy, antepartum on their problem list.  ----------------------------------------------------------------------------------- Patient reports no complaints.    . Vag. Bleeding: None.   . Leaking Fluid denies.  Dating u/s today confirms EDD, see NOTES for details ----------------------------------------------------------------------------------- The following portions of the patient's history were reviewed and updated as appropriate: allergies, current medications, past family history, past medical history, past social history, past surgical history and problem list. Problem list updated.  Objective  Blood pressure 110/70, weight 158 lb (71.7 kg), last menstrual period 11/13/2020. Pregravid weight 152 lb (68.9 kg) Total Weight Gain 6 lb (2.722 kg) Urinalysis: Urine Protein Negative  Urine Glucose Negative  Fetal Status: Fetal Heart Rate (bpm): 162         General:  Alert, oriented and cooperative. Patient is in no acute distress.  Skin: Skin is warm and dry. No rash noted.   Cardiovascular: Normal heart rate noted  Respiratory: Normal respiratory effort, no problems with respiration noted  Abdomen: Soft, gravid, appropriate for gestational age.       Pelvic:  Cervical exam deferred        Extremities: Normal range of motion.     Mental Status: Normal mood and affect. Normal behavior. Normal judgment and thought content.   Assessment   35 y.o. G2P0010 at [redacted]w[redacted]d by  08/20/2021, by Last Menstrual Period presenting for routine prenatal visit  Plan   pregnancy 1 Problems (from 01/07/21 to present)     Problem Noted Resolved   Supervision of other normal pregnancy, antepartum 01/07/2021 by Mirna Mires, CNM No   Overview Addendum  01/22/2021  3:50 PM by Mirna Mires, CNM     Clinic  Prenatal Labs  Dating  Blood type:     Genetic Screen 1 Screen:    AFP:     Quad:     NIPS: Antibody:   Anatomic [place]  Rubella:    GTT Early:               Third trimester:  RPR:     Flu vaccine  HBsAg:     TDaP vaccine                                               Rhogam: HIV:     Baby Food                                               GBS: (For PCN allergy, check sensitivities)  Contraception  [id number]  Circumcision    Pediatrician    Support Person     Pt has history of Elective abortion. Please do NOT mention this to her husband or family at any time.            Preterm labor symptoms and general obstetric precautions including but not limited to vaginal bleeding, contractions, leaking of fluid and fetal movement were reviewed in detail with the patient. Please refer to After Visit Summary for other counseling recommendations.   Return in about 4 weeks (  around 02/28/2021) for Routine Prenatal Appointment.   Thomasene Mohair, MD, [facility], [facility] 01/31/2021 2:58 PM

## 2021-02-03 LAB — RPR+RH+ABO+RUB AB+AB SCR+CB...
Antibody Screen: NEGATIVE
HIV Screen 4th Generation wRfx: NONREACTIVE
Hematocrit: 40.6 % (ref 34.0–46.6)
Hemoglobin: 14.1 g/dL (ref 11.1–15.9)
Hepatitis B Surface Ag: NEGATIVE
MCH: 31.8 pg (ref 26.6–33.0)
MCHC: 34.7 g/dL (ref 31.5–35.7)
MCV: 91 fL (ref 79–97)
Platelets: 330 10*3/uL (ref 150–450)
RBC: 4.44 x10E6/uL (ref 3.77–5.28)
RDW: 12 % (ref 11.7–15.4)
RPR Ser Ql: NONREACTIVE
Rh Factor: POSITIVE
Rubella Antibodies, IGG: 4.59 index (ref >0.99)
Varicella zoster IgG: 969 index (ref >165)
WBC: 12.2 10*3/uL — ABNORMAL HIGH (ref 3.4–10.8)

## 2021-02-03 LAB — HGB FRACTIONATION CASCADE
Hgb A2: 2.6 % (ref 1.8–3.2)
Hgb A: 97.4 % (ref 96.4–98.8)
Hgb F: 0 % (ref 0.0–2.0)
Hgb S: 0 %

## 2021-02-07 LAB — MATERNIT 21 PLUS CORE, BLOOD
Fetal Fraction: 11
Result (T21): NEGATIVE
Trisomy 13 (Patau syndrome): NEGATIVE
Trisomy 18 (Edwards syndrome): NEGATIVE
Trisomy 21 (Down syndrome): NEGATIVE

## 2021-02-28 ENCOUNTER — Encounter: Payer: Self-pay | Admitting: Obstetrics & Gynecology

## 2021-02-28 ENCOUNTER — Ambulatory Visit (INDEPENDENT_AMBULATORY_CARE_PROVIDER_SITE_OTHER): Payer: BC Managed Care – PPO | Admitting: Obstetrics & Gynecology

## 2021-02-28 ENCOUNTER — Other Ambulatory Visit: Payer: Self-pay

## 2021-02-28 VITALS — BP 120/80 | Wt 163.0 lb

## 2021-02-28 DIAGNOSIS — Z3A15 15 weeks gestation of pregnancy: Secondary | ICD-10-CM

## 2021-02-28 DIAGNOSIS — Z3482 Encounter for supervision of other normal pregnancy, second trimester: Secondary | ICD-10-CM

## 2021-02-28 NOTE — Progress Notes (Signed)
  Subjective  Min nausea and no pain or bleeding  Objective  BP 120/80   Wt 163 lb (73.9 kg)   LMP 11/13/2020 (Exact Date)   BMI 27.12 kg/m  General: NAD Pumonary: no increased work of breathing Abdomen: gravid, non-tender Extremities: no edema Psychiatric: mood appropriate, affect full  Assessment  35 y.o. G2P0010 at [redacted]w[redacted]d by  08/20/2021, by Last Menstrual Period presenting for routine prenatal visit  Plan   Problem List Items Addressed This Visit   None Visit Diagnoses    Encounter for supervision of other normal pregnancy in second trimester    -  Primary   [redacted] weeks gestation of pregnancy        PNV [place] nv for anatomy   Annamarie Major, MD, [facility], [facility] 02/28/2021  11:23 AM

## 2021-02-28 NOTE — Patient Instructions (Signed)
Thank you for choosing [facility]. As part of our ongoing efforts to improve patient experience, we would appreciate your feedback. Please fill out the short survey that you will receive by mail or MyChart. Your opinion is important to us! -Dr Chella Chapdelaine  Second Trimester of Pregnancy The second trimester of pregnancy is from week 13 through week 27. This is months 4 through 6 of pregnancy. The second trimester is often a time when you feel your best. Your body has adjusted to being pregnant, and you begin to feel better physically. During the second trimester: Morning sickness has lessened or stopped completely. You may have more energy. You may have an increase in appetite. The second trimester is also a time when the unborn baby (fetus) is growing rapidly. At the end of the sixth month, the fetus may be up to 12 inches long and weigh about 1 pounds. You will likely begin to feel the baby move (quickening) between 16 and 20 weeks of pregnancy. Body changes during your second trimester Your body continues to go through many changes during your second trimester. The changes vary and generally return to normal after the baby is born. Physical changes Your weight will continue to increase. You will notice your lower abdomen bulging out. You may begin to get stretch marks on your hips, abdomen, and breasts. Your breasts will continue to grow and to become tender. Dark spots or blotches (chloasma or mask of pregnancy) may develop on your face. A dark line from your belly button to the pubic area (linea nigra) may appear. You may have changes in your hair. These can include thickening of your hair, rapid growth, and changes in texture. Some people also have hair loss during or after pregnancy, or hair that feels dry or thin. Health changes You may develop headaches. You may have heartburn. You may develop constipation. You may develop hemorrhoids or swollen, bulging veins (varicose veins). Your  gums may bleed and may be sensitive to brushing and flossing. You may urinate more often because the fetus is pressing on your bladder. You may have back pain. This is caused by: Weight gain. Pregnancy hormones that are relaxing the joints in your pelvis. A shift in weight and the muscles that support your balance. Follow these instructions at home: Medicines Follow your health care provider's instructions regarding medicine use. Specific medicines may be either safe or unsafe to take during pregnancy. Do not take any medicines unless approved by your health care provider. Take a prenatal vitamin that contains at least 600 micrograms (mcg) of folic acid. Eating and drinking Eat a healthy diet that includes fresh fruits and vegetables, whole grains, good sources of protein such as meat, eggs, or tofu, and low-fat dairy products. Avoid raw meat and unpasteurized juice, milk, and cheese. These carry germs that can harm you and your baby. You may need to take these actions to prevent or treat constipation: Drink enough fluid to keep your urine pale yellow. Eat foods that are high in fiber, such as beans, whole grains, and fresh fruits and vegetables. Limit foods that are high in fat and processed sugars, such as fried or sweet foods. Activity Exercise only as directed by your health care provider. Most people can continue their usual exercise routine during pregnancy. Try to exercise for 30 minutes at least 5 days a week. Stop exercising if you develop contractions in your uterus. Stop exercising if you develop pain or cramping in the lower abdomen or lower back. Avoid   exercising if it is very hot or humid or if you are at a high altitude. Avoid heavy lifting. If you choose to, you may have sex unless your health care provider tells you not to. Relieving pain and discomfort Wear a supportive bra to prevent discomfort from breast tenderness. Take warm sitz baths to soothe any pain or discomfort  caused by hemorrhoids. Use hemorrhoid cream if your health care provider approves. Rest with your legs raised (elevated) if you have leg cramps or low back pain. If you develop varicose veins: Wear support hose as told by your health care provider. Elevate your feet for 15 minutes, 3-4 times a day. Limit salt in your diet. Safety Wear your seat belt at all times when driving or riding in a car. Talk with your health care provider if someone is verbally or physically abusive to you. Lifestyle Do not use hot tubs, steam rooms, or saunas. Do not douche. Do not use tampons or scented sanitary pads. Avoid cat litter boxes and soil used by cats. These carry germs that can cause birth defects in the baby and possibly loss of the fetus by miscarriage or stillbirth. Do not use herbal remedies, alcohol, illegal drugs, or medicines that are not approved by your health care provider. Chemicals in these products can harm your baby. Do not use any products that contain nicotine or tobacco, such as cigarettes, e-cigarettes, and chewing tobacco. If you need help quitting, ask your health care provider. General instructions During a routine prenatal visit, your health care provider will do a physical exam and other tests. He or she will also discuss your overall health. Keep all follow-up visits. This is important. Ask your health care provider for a referral to a local prenatal education class. Ask for help if you have counseling or nutritional needs during pregnancy. Your health care provider can offer advice or refer you to specialists for help with various needs. Where to find more information American Pregnancy Association: americanpregnancy.org American College of Obstetricians and Gynecologists: acog.org/en/Womens%20Health/Pregnancy Office on Women's Health: womenshealth.gov/pregnancy Contact a health care provider if you have: A headache that does not go away when you take medicine. Vision changes or  you see spots in front of your eyes. Mild pelvic cramps, pelvic pressure, or nagging pain in the abdominal area. Persistent nausea, vomiting, or diarrhea. A bad-smelling vaginal discharge or foul-smelling urine. Pain when you urinate. Sudden or extreme swelling of your face, hands, ankles, feet, or legs. A fever. Get help right away if you: Have fluid leaking from your vagina. Have spotting or bleeding from your vagina. Have severe abdominal cramping or pain. Have difficulty breathing. Have chest pain. Have fainting spells. Have not felt your baby move for the time period told by your health care provider. Have new or increased pain, swelling, or redness in an arm or leg. Summary The second trimester of pregnancy is from week 13 through week 27 (months 4 through 6). Do not use herbal remedies, alcohol, illegal drugs, or medicines that are not approved by your health care provider. Chemicals in these products can harm your baby. Exercise only as directed by your health care provider. Most people can continue their usual exercise routine during pregnancy. Keep all follow-up visits. This is important. This information is not intended to replace advice given to you by your health care provider. Make sure you discuss any questions you have with your health care provider. Document Revised: 01/03/2020 Document Reviewed: 11/09/2019 Elsevier Patient Education  2022 Elsevier Inc.  

## 2021-03-31 ENCOUNTER — Ambulatory Visit
Admission: RE | Admit: 2021-03-31 | Discharge: 2021-03-31 | Disposition: A | Discharge status: Home / Self Care | Payer: BC Managed Care – PPO | Source: Ambulatory Visit | Attending: Obstetrics and Gynecology | Admitting: Obstetrics and Gynecology

## 2021-03-31 ENCOUNTER — Other Ambulatory Visit: Payer: Self-pay

## 2021-03-31 DIAGNOSIS — Z3481 Encounter for supervision of other normal pregnancy, first trimester: Secondary | ICD-10-CM | POA: Insufficient documentation

## 2021-04-01 ENCOUNTER — Encounter: Payer: BC Managed Care – PPO | Admitting: Obstetrics and Gynecology

## 2021-04-04 ENCOUNTER — Other Ambulatory Visit: Payer: Self-pay

## 2021-04-04 ENCOUNTER — Encounter: Payer: Self-pay | Admitting: Obstetrics and Gynecology

## 2021-04-04 ENCOUNTER — Ambulatory Visit (INDEPENDENT_AMBULATORY_CARE_PROVIDER_SITE_OTHER): Payer: BC Managed Care – PPO | Admitting: Obstetrics and Gynecology

## 2021-04-04 VITALS — BP 126/74 | Wt 172.0 lb

## 2021-04-04 DIAGNOSIS — Z3A20 20 weeks gestation of pregnancy: Secondary | ICD-10-CM

## 2021-04-04 DIAGNOSIS — Z3482 Encounter for supervision of other normal pregnancy, second trimester: Secondary | ICD-10-CM

## 2021-04-04 NOTE — Progress Notes (Signed)
Routine Prenatal Care Visit  Subjective  Jennifer Pearson is a 35 y.o. G2P0010 at [redacted]w[redacted]d being seen today for ongoing prenatal care.  She is currently monitored for the following issues for this low-risk pregnancy and has Supervision of other normal pregnancy, antepartum on their problem list.  ----------------------------------------------------------------------------------- Patient reports no complaints.    . Vag. Bleeding: None.  Movement: Absent. Leaking Fluid denies.  ----------------------------------------------------------------------------------- The following portions of the patient's history were reviewed and updated as appropriate: allergies, current medications, past family history, past medical history, past social history, past surgical history and problem list. Problem list updated.  Objective  Blood pressure 126/74, weight 172 lb (78 kg), last menstrual period 11/13/2020. Pregravid weight 152 lb (68.9 kg) Total Weight Gain 20 lb (9.072 kg) Urinalysis: Urine Protein    Urine Glucose    Fetal Status: Fetal Heart Rate (bpm): 155   Movement: Absent     General:  Alert, oriented and cooperative. Patient is in no acute distress.  Skin: Skin is warm and dry. No rash noted.   Cardiovascular: Normal heart rate noted  Respiratory: Normal respiratory effort, no problems with respiration noted  Abdomen: Soft, gravid, appropriate for gestational age. Pain/Pressure: Absent     Pelvic:  Cervical exam deferred        Extremities: Normal range of motion.  Edema: None  Mental Status: Normal mood and affect. Normal behavior. Normal judgment and thought content.   Assessment   35 y.o. 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 Please do NOT mention this to her husband or family at any time.              Preterm labor symptoms and general obstetric precautions including but not limited to vaginal bleeding, contractions, leaking of fluid and fetal movement were reviewed in detail with the patient. Please refer to After Visit Summary for other counseling recommendations.   [facility] at [facility] complete  Return in about 4 weeks (around 05/02/2021) for ROB.   Thomasene Mohair, MD, [facility], [facility] 04/04/2021 2:37  PM

## 2021-04-25 ENCOUNTER — Other Ambulatory Visit: Payer: Self-pay

## 2021-04-25 ENCOUNTER — Ambulatory Visit (INDEPENDENT_AMBULATORY_CARE_PROVIDER_SITE_OTHER): Payer: BC Managed Care – PPO | Admitting: Obstetrics & Gynecology

## 2021-04-25 ENCOUNTER — Encounter: Payer: Self-pay | Admitting: Obstetrics & Gynecology

## 2021-04-25 VITALS — BP 122/80 | Wt 180.0 lb

## 2021-04-25 DIAGNOSIS — Z3A23 23 weeks gestation of pregnancy: Secondary | ICD-10-CM

## 2021-04-25 DIAGNOSIS — Z3482 Encounter for supervision of other normal pregnancy, second trimester: Secondary | ICD-10-CM

## 2021-04-25 DIAGNOSIS — Z23 Encounter for immunization: Secondary | ICD-10-CM | POA: Diagnosis not present

## 2021-04-25 DIAGNOSIS — Z3480 Encounter for supervision of other normal pregnancy, unspecified trimester: Secondary | ICD-10-CM

## 2021-04-25 DIAGNOSIS — Z131 Encounter for screening for diabetes mellitus: Secondary | ICD-10-CM

## 2021-04-25 DIAGNOSIS — O09522 Supervision of elderly multigravida, second trimester: Secondary | ICD-10-CM

## 2021-04-25 LAB — POCT URINALYSIS DIPSTICK OB
Glucose, UA: NEGATIVE
POC,PROTEIN,UA: NEGATIVE

## 2021-04-25 NOTE — Addendum Note (Signed)
Addended by: Cornelius Moras D on: 04/25/2021 01:38 PM   Modules accepted: Orders

## 2021-04-25 NOTE — Patient Instructions (Signed)

## 2021-04-25 NOTE — Progress Notes (Signed)
  Subjective  Fetal Movement? yes Contractions? no Leaking Fluid? no Vaginal Bleeding? no  Objective  BP 122/80   Wt 180 lb (81.6 kg)   LMP 11/13/2020 (Exact Date)   BMI 29.95 kg/m  General: NAD Pumonary: no increased work of breathing Abdomen: gravid, non-tender Extremities: no edema Psychiatric: mood appropriate, affect full  Assessment  35 y.o. G2P0010 at [redacted]w[redacted]d by  08/20/2021, by Last Menstrual Period presenting for routine prenatal visit  Plan   Problem List Items Addressed This Visit      Other   Supervision of other normal pregnancy, antepartum   Multigravida of advanced maternal age in second trimester  Other Visit Diagnoses    Encounter for supervision of other normal pregnancy in second trimester    -  Primary   [redacted] weeks gestation of pregnancy       Screening for diabetes mellitus       Relevant Orders   28 Week RH+Panel    Flu shot today Glucola nv PNV Plans to breast feed Plans POP  pregnancy 1 Problems (from 01/07/21 to present)    Problem Noted Resolved   Supervision of other normal pregnancy, antepartum 01/07/2021 by Mirna Mires, CNM No   Overview Addendum 04/25/2021  1:33 PM by Nadara Mustard, MD     [facility]inic [facility] Prenatal Labs  Dating L=11 Blood type: A/Positive/-- (06/24 1224)   Genetic Screen NIPS: diploid XX Antibody:Negative (06/24 1224)  Anatomic [place] complete Rubella: 4.59 (06/24 1224)  GTT Third trimester:  RPR: Non Reactive (06/24 1224)   Flu vaccine 04/25/21 HBsAg: Negative (06/24 1224)   TDaP vaccine                                               Rhogam: n/a HIV: Non Reactive (06/24 1224)   Baby Food             Breast                                  GBS: (For PCN allergy, check sensitivities)  Contraception             pop SAY:TKZS  Circumcision    Pediatrician    Support Person     Pt has history of Elective abortion. Please do NOT mention this to her husband or family at any time.          The following were addressed  during this visit:  Breastfeeding Education - Early initiation of breastfeeding  - The importance of exclusive breastfeeding  - Risks of giving your baby anything other than breast milk if you are breastfeeding  - Nonpharmacological pain relief methods for labor  - The importance of early skin-to-skin contact  - Rooming-in on a 24-hour basis  - Feeding on demand or baby-led feeding  - Frequent feeding to help assure optimal milk production  - Effective positioning and attachment  - Exclusive breastfeeding for the first 6 months  - Individualized Education   Annamarie Major, MD, [facility], [facility] 04/25/2021  1:34 PM

## 2021-05-06 ENCOUNTER — Telehealth: Payer: Self-pay

## 2021-05-06 NOTE — Telephone Encounter (Signed)
Pt calling; is 25wks tomorrow; for the past 1 1/2 wks has been taking Bps; the highest it's been is 136/70s.  Normally bottom number is lower like 70 or 72 but not any higher; then it will be 122/58.  At what point does she need to come in?  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  [phone]

## 2021-05-06 NOTE — Telephone Encounter (Signed)
Pt aware; states MMF has spoken to her as well and trying to get her an appt.

## 2021-05-23 ENCOUNTER — Other Ambulatory Visit: Payer: Self-pay

## 2021-05-23 ENCOUNTER — Other Ambulatory Visit: Payer: BC Managed Care – PPO

## 2021-05-23 ENCOUNTER — Ambulatory Visit (INDEPENDENT_AMBULATORY_CARE_PROVIDER_SITE_OTHER): Payer: BC Managed Care – PPO | Admitting: Obstetrics and Gynecology

## 2021-05-23 VITALS — BP 122/80 | Wt 186.0 lb

## 2021-05-23 DIAGNOSIS — Z3480 Encounter for supervision of other normal pregnancy, unspecified trimester: Secondary | ICD-10-CM

## 2021-05-23 DIAGNOSIS — Z131 Encounter for screening for diabetes mellitus: Secondary | ICD-10-CM

## 2021-05-23 DIAGNOSIS — Z3A27 27 weeks gestation of pregnancy: Secondary | ICD-10-CM

## 2021-05-23 DIAGNOSIS — O09522 Supervision of elderly multigravida, second trimester: Secondary | ICD-10-CM

## 2021-05-23 NOTE — Progress Notes (Signed)
ROB - 1 hr gtt

## 2021-05-23 NOTE — Progress Notes (Signed)
Routine Prenatal Care Visit  Subjective  Jennifer Pearson is a 35 y.o. G2P0010 at [redacted]w[redacted]d being seen today for ongoing prenatal care.  She is currently monitored for the following issues for this low-risk pregnancy and has Supervision of other normal pregnancy, antepartum and Multigravida of advanced maternal age in second trimester on their problem list.  ----------------------------------------------------------------------------------- Patient reports no complaints.   Contractions: Not present. Vag. Bleeding: None.  Movement: Present. Denies leaking of fluid.  ----------------------------------------------------------------------------------- The following portions of the patient's history were reviewed and updated as appropriate: allergies, current medications, past family history, past medical history, past social history, past surgical history and problem list. Problem list updated.   Objective  Blood pressure 122/80, weight 186 lb (84.4 kg), last menstrual period 11/13/2020. Pregravid weight 152 lb (68.9 kg) Total Weight Gain 34 lb (15.4 kg) Urinalysis:      Fetal Status: Fetal Heart Rate (bpm): 150 Fundal Height: 27 cm Movement: Present     General:  Alert, oriented and cooperative. Patient is in no acute distress.  Skin: Skin is warm and dry. No rash noted.   Cardiovascular: Normal heart rate noted  Respiratory: Normal respiratory effort, no problems with respiration noted  Abdomen: Soft, gravid, appropriate for gestational age. Pain/Pressure: Present     Pelvic:  Cervical exam deferred        Extremities: Normal range of motion.     ental Status: Normal mood and affect. Normal behavior. Normal judgment and thought content.     Assessment   35 y.o. G2P0010 at [redacted]w[redacted]d by  08/20/2021, by Last Menstrual Period presenting for routine prenatal visit  Plan   pregnancy 1 Problems (from 01/07/21 to present)     Problem Noted Resolved   Supervision of other normal pregnancy,  antepartum 01/07/2021 by Mirna Mires, CNM No   Overview Addendum 04/25/2021  1:33 PM by Nadara Mustard, MD     [facility]inic [facility] Prenatal Labs  Dating L=11 Blood type: A/Positive/-- (06/24 1224)   Genetic Screen NIPS: diploid XX Antibody:Negative (06/24 1224)  Anatomic [place] complete Rubella: 4.59 (06/24 1224)  GTT Third trimester:  RPR: Non Reactive (06/24 1224)   Flu vaccine 04/25/21 HBsAg: Negative (06/24 1224)   TDaP vaccine                                               Rhogam: n/a HIV: Non Reactive (06/24 1224)   Baby Food             Breast                                  GBS: (For PCN allergy, check sensitivities)  Contraception             pop BTD:VVOH  Circumcision    Pediatrician    Support Person     Pt has history of Elective abortion. Please do NOT mention this to her husband or family at any time.              Gestational age appropriate obstetric precautions including but not limited to vaginal bleeding, contractions, leaking of fluid and fetal movement were reviewed in detail with the patient.    - TDAP discussed for next visit - 28 week labs today - Rh negative  Return in about 2 weeks (around 06/06/2021) for ROB every 2 weeks for next 8 weeks.  Vena Austria, MD, [facility], [facility] 05/23/2021, 11:29 AM

## 2021-05-24 LAB — 28 WEEK RH+PANEL
Basophils Absolute: 0 10*3/uL (ref 0.0–0.2)
Basos: 0 %
EOS (ABSOLUTE): 0.1 10*3/uL (ref 0.0–0.4)
Eos: 1 %
Gestational Diabetes Screen: 142 mg/dL — ABNORMAL HIGH (ref 70–139)
HIV Screen 4th Generation wRfx: NONREACTIVE
Hematocrit: 35.5 % (ref 34.0–46.6)
Hemoglobin: 12.2 g/dL (ref 11.1–15.9)
Immature Grans (Abs): 0.1 10*3/uL (ref 0.0–0.1)
Immature Granulocytes: 1 %
Lymphocytes Absolute: 1.4 10*3/uL (ref 0.7–3.1)
Lymphs: 11 %
MCH: 31.5 pg (ref 26.6–33.0)
MCHC: 34.4 g/dL (ref 31.5–35.7)
MCV: 92 fL (ref 79–97)
Monocytes Absolute: 0.7 10*3/uL (ref 0.1–0.9)
Monocytes: 5 %
Neutrophils Absolute: 10.9 10*3/uL — ABNORMAL HIGH (ref 1.4–7.0)
Neutrophils: 82 %
Platelets: 320 10*3/uL (ref 150–450)
RBC: 3.87 x10E6/uL (ref 3.77–5.28)
RDW: 12.6 % (ref 11.7–15.4)
RPR Ser Ql: NONREACTIVE
WBC: 13.2 10*3/uL — ABNORMAL HIGH (ref 3.4–10.8)

## 2021-05-24 NOTE — Progress Notes (Signed)
Sch 3 hour GTT

## 2021-05-26 ENCOUNTER — Telehealth: Payer: Self-pay

## 2021-05-26 NOTE — Telephone Encounter (Signed)
Attempt to reach patient. Phone on file no accepting calls at this time.

## 2021-05-26 NOTE — Telephone Encounter (Signed)
Please advise 

## 2021-05-26 NOTE — Telephone Encounter (Signed)
Patient is scheduled for 06/06/21.

## 2021-05-26 NOTE — Telephone Encounter (Signed)
-----   Message from Nadara Mustard, MD sent at 05/24/2021  2:07 PM EDT ----- Sch 3 hour GTT

## 2021-06-06 ENCOUNTER — Other Ambulatory Visit: Payer: Self-pay

## 2021-06-06 ENCOUNTER — Encounter: Payer: Self-pay | Admitting: Obstetrics & Gynecology

## 2021-06-06 ENCOUNTER — Ambulatory Visit (INDEPENDENT_AMBULATORY_CARE_PROVIDER_SITE_OTHER): Payer: BC Managed Care – PPO | Admitting: Obstetrics & Gynecology

## 2021-06-06 ENCOUNTER — Other Ambulatory Visit: Payer: Self-pay | Admitting: Obstetrics & Gynecology

## 2021-06-06 ENCOUNTER — Other Ambulatory Visit: Payer: BC Managed Care – PPO

## 2021-06-06 VITALS — BP 120/80 | Wt 190.0 lb

## 2021-06-06 DIAGNOSIS — O99810 Abnormal glucose complicating pregnancy: Secondary | ICD-10-CM

## 2021-06-06 DIAGNOSIS — Z3483 Encounter for supervision of other normal pregnancy, third trimester: Secondary | ICD-10-CM

## 2021-06-06 DIAGNOSIS — O09523 Supervision of elderly multigravida, third trimester: Secondary | ICD-10-CM

## 2021-06-06 DIAGNOSIS — Z3A29 29 weeks gestation of pregnancy: Secondary | ICD-10-CM

## 2021-06-06 NOTE — Patient Instructions (Signed)

## 2021-06-06 NOTE — Progress Notes (Signed)
  Subjective  Fetal Movement? yes Contractions? no Leaking Fluid? no Vaginal Bleeding? no Pt has 3 day history or LEFT side pain, upper and towards back along ribs, severe at times, no radiation, icy hot and positioning help some, does not feel realted or near pregnancy  Objective  BP 120/80   Wt 190 lb (86.2 kg)   LMP 11/13/2020 (Exact Date)   BMI 31.62 kg/m  General: NAD Pumonary: no increased work of breathing Abdomen: gravid, non-tender Palpable tenderness along upper left side and back; no CVAT; appears superficial and along ribs Extremities: no edema Psychiatric: mood appropriate, affect full  Assessment  35 y.o. G2P0010 at [redacted]w[redacted]d by  08/20/2021, by Last Menstrual Period presenting for routine prenatal visit  Plan   Problem List Items Addressed This Visit      Left sided pain    Rib pain.  No known trauma.  Not apparent kidney, uterine, ovarian.  Rare concern for spleen considered.  Ice, Tylenol, heat, rest.  Pros and cons of CT to further evaluate discussed. Supervision of other normal pregnancy, antepartum     PNV, FMC    3 hour today as glucola elevated    Discussed options for GDM management if diagnosed today   Multigravida of advanced maternal age in second trimester   [redacted] weeks gestation of pregnancy              [facility] Prenatal Labs  Dating L=11 Blood type: A/Positive/-- (06/24 1224)   Genetic Screen NIPS: diploid XX Antibody:Negative (06/24 1224)  Anatomic [place] complete Rubella: 4.59 (06/24 1224)  GTT Third trimester:  RPR: Non Reactive (06/24 1224)   Flu vaccine 04/25/21 HBsAg: Negative (06/24 1224)   TDaP vaccine                                               Rhogam: n/a HIV: Non Reactive (06/24 1224)   Baby Food             Breast                                  GBS: (For PCN allergy, check sensitivities)  Contraception             pop JSR:PRXY  Circumcision    Pediatrician    Support Person     Pt has history of Elective abortion. Please do  NOT mention this to her husband or family at any time.       Annamarie Major, MD, [facility], [facility] 06/06/2021  9:02 AM

## 2021-06-07 LAB — GESTATIONAL GLUCOSE TOLERANCE
Glucose, Fasting: 86 mg/dL (ref 70–94)
Glucose, GTT - 1 Hour: 204 mg/dL — ABNORMAL HIGH (ref 70–179)
Glucose, GTT - 2 Hour: 191 mg/dL — ABNORMAL HIGH (ref 70–154)
Glucose, GTT - 3 Hour: 130 mg/dL (ref 70–139)

## 2021-06-09 ENCOUNTER — Other Ambulatory Visit: Payer: Self-pay | Admitting: Obstetrics & Gynecology

## 2021-06-09 DIAGNOSIS — O24410 Gestational diabetes mellitus in pregnancy, diet controlled: Secondary | ICD-10-CM

## 2021-06-09 MED ORDER — ACCU-CHEK SMARTVIEW VI STRP: ORAL_STRIP | 12 refills | Status: DC

## 2021-06-09 MED ORDER — ACCU-CHEK SOFTCLIX LANCETS MISC: 100.0000 | Freq: Four times a day (QID) | 12 refills | Status: DC

## 2021-06-09 MED ORDER — ACCU-CHEK NANO SMARTVIEW W/DEVICE KIT: 1.0000 | PACK | 0 refills | Status: DC

## 2021-06-20 ENCOUNTER — Encounter: Payer: BC Managed Care – PPO | Attending: Obstetrics & Gynecology | Admitting: *Deleted

## 2021-06-20 ENCOUNTER — Other Ambulatory Visit: Payer: Self-pay

## 2021-06-20 ENCOUNTER — Encounter: Payer: Self-pay | Admitting: *Deleted

## 2021-06-20 ENCOUNTER — Encounter: Payer: Self-pay | Admitting: Obstetrics & Gynecology

## 2021-06-20 ENCOUNTER — Ambulatory Visit (INDEPENDENT_AMBULATORY_CARE_PROVIDER_SITE_OTHER): Payer: BC Managed Care – PPO | Admitting: Obstetrics & Gynecology

## 2021-06-20 VITALS — BP 120/70 | Wt 190.0 lb

## 2021-06-20 VITALS — BP 112/70 | Ht 65.0 in | Wt 190.4 lb

## 2021-06-20 DIAGNOSIS — Z3483 Encounter for supervision of other normal pregnancy, third trimester: Secondary | ICD-10-CM

## 2021-06-20 DIAGNOSIS — Z3480 Encounter for supervision of other normal pregnancy, unspecified trimester: Secondary | ICD-10-CM

## 2021-06-20 DIAGNOSIS — O24410 Gestational diabetes mellitus in pregnancy, diet controlled: Secondary | ICD-10-CM | POA: Insufficient documentation

## 2021-06-20 DIAGNOSIS — Z3A31 31 weeks gestation of pregnancy: Secondary | ICD-10-CM

## 2021-06-20 LAB — POCT URINALYSIS DIPSTICK OB
Glucose, UA: NEGATIVE
POC,PROTEIN,UA: NEGATIVE

## 2021-06-20 NOTE — Progress Notes (Signed)
Prenatal Visit Note Date: 06/20/2021 Clinic: [facility]  Subjective:  Jennifer Pearson is a 35 y.o. G2P0010 at [redacted]w[redacted]d being seen today for ongoing prenatal care.  She is currently monitored for the following issues for this high-risk pregnancy and has Supervision of other normal pregnancy, antepartum; Multigravida of advanced maternal age in second trimester; and Diet controlled gestational diabetes mellitus (GDM) in third trimester on their problem list.  Patient reports just now getting glucometer for GDM monitoring.  Saw lifestyles today.  .   Contractions: Not present. Vag. Bleeding: None.  Movement: Present. Denies leaking of fluid.   The following portions of the patient's history were reviewed and updated as appropriate: allergies, current medications, past family history, past medical history, past social history, past surgical history and problem list. Problem list updated.  Objective:   Vitals:   06/20/21 1030  BP: 120/70  Weight: 190 lb (86.2 kg)    Fetal Status:     Movement: Present     General:  Alert, oriented and cooperative. Patient is in no acute distress.  Skin: Skin is warm and dry. No rash noted.   Cardiovascular: Normal heart rate noted  Respiratory: Normal respiratory effort, no problems with respiration noted  Abdomen: Soft, gravid, appropriate for gestational age. Pain/Pressure: Absent     Pelvic:  Cervical exam deferred        Extremities: Normal range of motion.     Mental Status: Normal mood and affect. Normal behavior. Normal judgment and thought content.    Assessment and Plan:  Pregnancy: G2P0010 at [redacted]w[redacted]d  1. Encounter for supervision of other normal pregnancy in third trimester - POC Urinalysis Dipstick OB  2. Diet controlled gestational diabetes mellitus (GDM) in third trimester Start daily BS log Return 1 week  Diet discussed ASA daily  3. [redacted] weeks gestation of pregnancy PNV, FMC  4. Supervision of other normal pregnancy,  antepartum  Preterm labor symptoms and general obstetric precautions including but not limited to vaginal bleeding, contractions, leaking of fluid and fetal movement were reviewed in detail with the patient. Please refer to After Visit Summary for other counseling recommendations.   pregnancy 1 Problems (from 01/07/21 to present)    Problem Noted Resolved   Diet controlled gestational diabetes mellitus (GDM) in third trimester 06/20/2021 by Nadara Mustard, MD No   Overview Signed 06/20/2021 11:11 AM by Nadara Mustard, MD    Current Diabetic Medications:  None  [x ] Aspirin 81 mg daily after 12 weeks; discontinue after 36 weeks (? A2/B GDM)  Required Referrals for A1GDM or A2GDM: [ ]  Diabetes Education and Testing Supplies [ ]  Nutrition Cousult  For A2/B GDM or higher classes of DM [ ]  Diabetes Education and Testing Supplies [ ]  Nutrition Counsult [ ]  Fetal ECHO after 22-24 weeks  [ ]  Eye exam for retina evaluation  [ ]  Baseline EKG [ ]  fetal growth every 4 weeks starting at 28 weeks [ ]  Twice weekly NST starting at [redacted] weeks gestation [ ]  Delivery planning contingent on fetal growth, AFI, glycemic control, and other co-morbidities but at least by 39 weeks  Baseline and surveillance labs (pulled in from EPIC, refresh links as needed)  No results found for: CREATININE, AST, ALT, TSH, PROTCRRATIO, LABPROT, PROTEIN24HR No results found for: HGBA1C  Antenatal Testing Class of DM U/S NST/AFI DELIVERY  Diabetes   A1 - good control - O24.410    A2 - good control - O24.419      A2  -  poor control or poor compliance - O24.419, E11.65   (Macrosomia or polyhydramnios) **E11.65 is extra code for poor control**    A2/B - O24.919  and B-C O24.319  Poor control B-C or D-R-F-T - O24.319  or  Type I DM - O24.019  20-38  20-38  20-24-28-32-36   20-24-28-32-35-38//fetal echo  20-24-27-30-33-36-38//fetal echo  40  32//2 x wk  32//2 x wk   32//2 x wk  28//BPP wkly then  32//2 x wk  40  39  PRN   39  PRN           Supervision of other normal pregnancy, antepartum 01/07/2021 by Mirna Mires, CNM No   Overview Addendum 06/20/2021 11:23 AM by Nadara Mustard, MD     [facility]inic [facility] Prenatal Labs  Dating L=11 Blood type: A/Positive/-- (06/24 1224)   Genetic Screen NIPS: diploid XX Antibody:Negative (06/24 1224)  Anatomic [place] complete Rubella: 4.59 (06/24 1224)  GTT Third trimester:  RPR: Non Reactive (06/24 1224)   Flu vaccine 04/25/21 HBsAg: Negative (06/24 1224)   TDaP vaccine  plan nv                                              Rhogam: n/a HIV: Non Reactive (06/24 1224)   Baby Food             Breast                                  GBS: (For PCN allergy, check sensitivities)  Contraception             pop RUE:AVWU  Circumcision    Pediatrician    Support Person     Pt has history of Elective abortion. Please do NOT mention this to her husband or family at any time.             Return in 1 week (on 06/27/2021) for HROB, must be this day.  Annamarie Major, MD, [facility], [facility] 06/20/2021  11:24 AM

## 2021-06-20 NOTE — Progress Notes (Signed)
Diabetes Self-Management Education  Visit Type: First/Initial  Appt. Start Time: 0825 Appt. End Time: 0935  06/20/2021  [name], identified by name and date of birth, is a 35 y.o. female with a diagnosis of Diabetes: Gestational Diabetes.   ASSESSMENT  Blood pressure 112/70, height 5\' 5"  (1.651 m), weight 190 lb 6.4 oz (86.4 kg), last menstrual period 11/13/2020, estimated date of delivery 08/20/2021 Body mass index is 31.68 kg/m.   Diabetes Self-Management Education - 06/20/21 0951       Visit Information   Visit Type First/Initial      Initial Visit   Diabetes Type Gestational Diabetes    Are you currently following a meal plan? No    Are you taking your medications as prescribed? Yes    Date Diagnosed Jun 07, 2021      Health Coping   How would you rate your overall health? Good      Psychosocial Assessment   Patient Belief/Attitude about Diabetes Other (comment)   "clueless"   Self-care barriers None    Self-management support Doctor's office;Family    Other persons present Spouse/SO    Patient Concerns Nutrition/Meal planning;Glycemic Control;Weight Control;Monitoring    Special Needs None    Preferred Learning Style Auditory;Visual;Hands on    Learning Readiness Ready    How often do you need to have someone help you when you read instructions, pamphlets, or other written materials from your doctor or pharmacy? 1 - Never    What is the last grade level you completed in school? Assoc degree      Pre-Education Assessment   Patient understands the diabetes disease and treatment process. Needs Instruction    Patient understands incorporating nutritional management into lifestyle. Needs Instruction    Patient undertands incorporating physical activity into lifestyle. Needs Instruction    Patient understands using medications safely. Needs Instruction    Patient understands monitoring blood glucose, interpreting and using results Needs Instruction    Patient  understands prevention, detection, and treatment of acute complications. Needs Instruction    Patient understands prevention, detection, and treatment of chronic complications. Needs Instruction    Patient understands how to develop strategies to address psychosocial issues. Needs Instruction    Patient understands how to develop strategies to promote health/change behavior. Needs Instruction      Complications   How often do you check your blood sugar? 0 times/day (not testing)   Pt brought her meter and was instructed on use. BG upon return demonstration was 119 mg/dL at Jun 09, 2021 am - 2 hrs pp.   Have you had a dilated eye exam in the past 12 months? No    Have you had a dental exam in the past 12 months? Yes    Are you checking your feet? Yes    How many days per week are you checking your feet? 7      Dietary Intake   Breakfast Greek yogurt, cheese    Snack (morning) nuts and cheese    Lunch grilled chicken salad, left overs    Snack (afternoon) popcorn, fruit (banana, apple - loves all fruit)    Dinner chicken, pork chop, beef, fish; potatoes, rice, pasta, sporadically eats beans, peas, corn; asparagus, green beans, squash, salads with lettuce, tomatoes, cuccumbers, cheese    Snack (evening) sometimes fruit - apple    Beverage(s) water      Exercise   Exercise Type ADL's      Patient Education   Previous Diabetes Education No  Disease state  Definition of diabetes, type 1 and 2, and the diagnosis of diabetes;Factors that contribute to the development of diabetes    Nutrition management  Role of diet in the treatment of diabetes and the relationship between the three main macronutrients and blood glucose level;Food label reading, portion sizes and measuring food.;Reviewed blood glucose goals for pre and post meals and how to evaluate the patients' food intake on their blood glucose level.    Physical activity and exercise  Role of exercise on diabetes management, blood pressure control  and cardiac health.    Medications Other (comment)   limited use of oral medications during pregnancy and potential for insulin   Monitoring Taught/evaluated SMBG meter.;Purpose and frequency of SMBG.;Taught/discussed recording of test results and interpretation of SMBG.;Identified appropriate SMBG and/or A1C goals.;Ketone testing, when, how.    Chronic complications Relationship between chronic complications and blood glucose control    Psychosocial adjustment Identified and addressed patients feelings and concerns about diabetes;Role of stress on diabetes    Preconception care Pregnancy and GDM  Role of pre-pregnancy blood glucose control on the development of the fetus;Reviewed with patient blood glucose goals with pregnancy;Role of family planning for patients with diabetes      Individualized Goals (developed by patient)   Reducing Risk Other (comment)   improve blood sugars, prevent diabetes complications, lose weight     Outcomes   Expected Outcomes Demonstrated interest in learning. Expect positive outcomes    Future DMSE 2 wks        Individualized Plan for Diabetes Self-Management Training:   Learning Objective:  Patient will have a greater understanding of diabetes self-management. Patient education plan is to attend individual and/or group sessions per assessed needs and concerns.   Plan:   Patient Instructions  Read booklet on Gestational Diabetes Follow Gestational Meal Planning Guidelines Allow 2-3 hours between meals and snacks Include 1 serving of protein if eating fruit for a snack Complete a 3 Day Food Record and bring to next appointment Check blood sugars 4 x day - before breakfast and 2 hrs after every meal and record  Bring blood sugar log to all appointments Purchase urine ketone strips if instructed by MD and check urine ketones every am:  If + increase bedtime snack to 1 protein and 2 carbohydrate servings Walk 20-30 minutes at least 5 x week if permitted by  MD  Expected Outcomes:  Demonstrated interest in learning. Expect positive outcomes  Education material provided:  Gestational Booklet Gestational Meal Planning Guidelines Simple Meal Plan 3 Day Food Record Goals for a Healthy Pregnancy   If problems or questions, patient to contact team via:   Sharion Settler, RN, CCM, CDCES [phone]  Future DSME appointment: 2 wks June 30, 2021 with the dietitian

## 2021-06-20 NOTE — Patient Instructions (Signed)
Read booklet on Gestational Diabetes Follow Gestational Meal Planning Guidelines Allow 2-3 hours between meals and snacks Include 1 serving of protein if eating fruit for a snack Complete a 3 Day Food Record and bring to next appointment Check blood sugars 4 x day - before breakfast and 2 hrs after every meal and record  Bring blood sugar log to all appointments Purchase urine ketone strips if instructed by MD and check urine ketones every am:  If + increase bedtime snack to 1 protein and 2 carbohydrate servings Walk 20-30 minutes at least 5 x week if permitted by MD

## 2021-06-20 NOTE — Patient Instructions (Signed)
Type 1 or Type 2 Diabetes Mellitus During Pregnancy, Self Care Caring for yourself during your pregnancy when you have type 1 or type 2 diabetes (diabetes mellitus) means keeping your blood sugar (glucose) under control with a balance of: Healthy eating. Exercise. Lifestyle changes. Insulin or medicines, if needed. Support from your health care team and others. The following information explains what you need to know to manage your diabetes at home during your pregnancy. What are the risks? If diabetes is treated, it is unlikely to cause problems. If it is not controlled with treatment: It may cause problems during labor and delivery. Some of those problems can be harmful to the unborn baby (fetus) and the mother. It may also cause a newborn to have breathing problems and low blood glucose. Having diabetes can put you at risk for other long-term, or chronic, conditions, such as heart disease and kidney disease. Your health care provider may prescribe medicines to help prevent complications from diabetes. How to monitor blood glucose Be sure to: Check your blood glucose every day, as often as told by your health care provider. Contact your health care provider if blood glucose is above your target for 2 tests in a row. Have your hemoglobin A1C (HbA1C) level checked as often as told. Your health care provider will set personal treatment goals for you. Generally, the goal of treatment is to maintain the following blood glucose levels during pregnancy: After not eating (after fasting) for 8 hours: at or below 95 mg/dL (5.3 mmol/L). After meals: One hour after a meal: at or below 140 mg/dL (7.8 mmol/L). Two hours after a meal: at or below 120 mg/dL (6.7 mmol/L). HbA1C level: less than 6%. Follow these instructions at home: Medicines Take over-the-counter and prescription medicines only as told by your health care provider. This includes diabetes medicines. Take insulin or diabetes medicines  every day, if your health care provider prescribed them. Do not run out of insulin or other diabetes medicines that you take. Plan ahead so you always have these available. If you use insulin, adjust your dosage based on your physical activity and what foods you eat. Your health care provider will tell you how to adjust your dosage. Your health care provider may recommend that you take one low-dose aspirin (81 mg) each day to help prevent high blood pressure during pregnancy (preeclampsia or eclampsia). You may be at risk for preeclampsia or eclampsia if: You had any of the following during a previous pregnancy: Preeclampsia or eclampsia. A fetal growth rate that was slower than normal. An early, or preterm, birth. Placental abruption. This is when the placenta separates from the uterus. Loss of an unborn baby (fetal loss). You are pregnant with more than one baby. You have other medical conditions, such as high blood pressure or an autoimmune disease. Eating and drinking What you eat and drink affects your blood glucose and your insulin dosage. Making good choices helps to control your diabetes and prevent other health problems. A healthy meal plan includes eating lean proteins, complex carbohydrates, fresh fruits and vegetables, low-fat dairy products, and healthy fats. Make an appointment to see a registered dietitian to help you create an eating plan that is right for you. Make sure that you: Follow instructions from your health care provider about eating or drinking restrictions. Drink enough fluid to keep your urine pale yellow. Eat healthy snacks between nutritious meals. Keep a record of the carbohydrates that you eat. Do this by reading food labels and learning  the standard serving sizes of foods. Follow your sick-day plan whenever you cannot eat or drink as usual. Make this plan in advance with your health care provider.  Activity Do at least 30 minutes of physical activity a day, or  as much as your health care provider recommends during your pregnancy. If you start a new exercise or activity, work with your health care provider to adjust your insulin, medicines, or food intake as needed. Lifestyle Do not drink alcohol. Do not use any products that contain nicotine or tobacco. These products include cigarettes, chewing tobacco, and vaping devices, such as e-cigarettes. If you need help quitting, ask your health care provider. Learn to manage stress. If you need help with this, ask your health care provider. Care for your body Keep your immunizations up to date. Schedule an eye exam during the first term (trimester) of your pregnancy, or as told. Check your skin and feet every day for cuts, bruises, redness, blisters, or sores. Schedule a foot exam once every year. Brush your teeth and gums two times a day, and floss one or more times a day. Visit your dentist one or more times every 6 months. Maintain a healthy weight during your pregnancy. General instructions Talk with your health care provider about your risk for preeclampsia or eclampsia. Share your diabetes management plan with people in your workplace, school, and household. Check your urine for ketones when you are sick and as told. Wear a medical alert bracelet or carry a medical alert card. Keep all follow-up visits during your pregnancy (prenatal) and after delivery (postnatal). This is important. Questions to ask your health care provider Do I need to meet with a certified diabetes care and education specialist? Where can I find a support group for people with diabetes? Where to find more information American Diabetes Association (ADA): diabetes.org Association of Diabetes Care & Education Specialists (ADCES): diabeteseducator.org International Diabetes Federation (IDF): http://hill.biz/ Contact a health care provider if: Your blood glucose is at or above 240 mg/dL (01.7 mmol/L). You have been sick or have had a  fever for 2 days or more and you are not getting better. You have any of the following problems for more than 6 hours: You cannot eat or drink. You have nausea and vomiting. You have diarrhea. Get help right away if: You have severe hypoglycemia. This means your blood sugar is below 54 mg/dL (3 mmol/L). You become confused. You have trouble thinking clearly. You have trouble breathing. Your baby is moving around less than usual. You develop unusual discharge from your vagina. You start having contractions early (prematurely). These may feel like tightening in your lower abdomen. These symptoms may represent a serious problem that is an emergency. Do not wait to see if the symptoms will go away. Get medical help right away. Call your local emergency services (911 in the [place] Do not drive yourself to the hospital. Summary Caring for yourself when you have type 1 or type 2 diabetes means keeping your blood sugar (glucose) under control. You can do that with a balance of insulin and other medicines, healthy eating, exercise, and lifestyle changes. Check your blood glucose every day, as often as told by your health care provider. Take insulin or diabetes medicines every day, if your health care provider prescribed them. Keep all follow-up visits during your pregnancy (prenatal) and after delivery (postnatal). This is important. This information is not intended to replace advice given to you by your health care provider. Make sure you  discuss any questions you have with your health care provider. Document Revised: 02/21/2020 Document Reviewed: 02/21/2020 Elsevier Patient Education  2022 [employer].

## 2021-06-27 ENCOUNTER — Encounter: Payer: BC Managed Care – PPO | Admitting: Obstetrics & Gynecology

## 2021-06-30 ENCOUNTER — Encounter: Payer: BC Managed Care – PPO | Admitting: Dietician

## 2021-06-30 ENCOUNTER — Encounter: Payer: Self-pay | Admitting: Dietician

## 2021-06-30 ENCOUNTER — Other Ambulatory Visit: Payer: Self-pay

## 2021-06-30 VITALS — Ht 65.0 in | Wt 195.4 lb

## 2021-06-30 DIAGNOSIS — O24410 Gestational diabetes mellitus in pregnancy, diet controlled: Secondary | ICD-10-CM

## 2021-06-30 NOTE — Progress Notes (Signed)
Patient's BG record indicates fasting BGs ranging 98-112 (9 readings) , and post-meal BGs ranging 91-130 (29 readings) + 2 readings of 143, 166 after eating sweets. Next MD appt is on 07/07/21; advised patient to call sooner if fasting BGs increase. Patient's food diary indicates most meals lacking a carbohydrate source, inclusion of lean protein foods and low carb vegetables. Patient is eating at regular intervals.   Provided basic balanced meal plan, and wrote individualized menus based on patient's food preferences. Advised including at least 1 serving of a healthy carb food with each meal for adequate energy and fiber. Discussed increase in breakdown of hepatic glycogen when consuming very low carb meals and increased sensitivity when carbohydrate is consumed only occasionally Instructed on hypoglycemia treatment.  Instructed patient on food safety, including avoidance of Listeriosis, and limiting mercury from fish. Discussed importance of maintaining healthy lifestyle habits to reduce risk of Type 2 DM as well as Gestational DM with any future pregnancies. Advised patient to use any remaining testing supplies to test some BGs after delivery, and to have BG tested ideally annually, as well as prior to attempting future pregnancies.

## 2021-06-30 NOTE — Patient Instructions (Signed)
Include at least 15g carbs with each meal, gradually increase to 30 up to 45 grams.  If fasting blood sugars increase above current levels, call Dr prior to next appointment. If you have symptoms of low blood sugar, run a test, and if BG is below 70, drink 4oz of juice, sweet tea, lemonade, or regular (not diet) soda. Retest after 10-15 minutes, and if BG is normal, eat a meal or snack within 30 minutes. If still low, drink another 4oz of drink and then eat meal or snack.

## 2021-07-07 ENCOUNTER — Other Ambulatory Visit: Payer: Self-pay

## 2021-07-07 ENCOUNTER — Ambulatory Visit (INDEPENDENT_AMBULATORY_CARE_PROVIDER_SITE_OTHER): Payer: BC Managed Care – PPO | Admitting: Obstetrics and Gynecology

## 2021-07-07 VITALS — BP 120/72 | Wt 193.0 lb

## 2021-07-07 DIAGNOSIS — O09523 Supervision of elderly multigravida, third trimester: Secondary | ICD-10-CM

## 2021-07-07 DIAGNOSIS — O09522 Supervision of elderly multigravida, second trimester: Secondary | ICD-10-CM

## 2021-07-07 DIAGNOSIS — Z3480 Encounter for supervision of other normal pregnancy, unspecified trimester: Secondary | ICD-10-CM

## 2021-07-07 DIAGNOSIS — Z3A30 30 weeks gestation of pregnancy: Secondary | ICD-10-CM

## 2021-07-07 DIAGNOSIS — Z23 Encounter for immunization: Secondary | ICD-10-CM

## 2021-07-07 DIAGNOSIS — O24410 Gestational diabetes mellitus in pregnancy, diet controlled: Secondary | ICD-10-CM

## 2021-07-07 MED ORDER — INSULIN PEN NEEDLE 32G X 6 MM MISC: 1.0000 [IU] | Freq: Every day | 3 refills | Status: DC

## 2021-07-07 MED ORDER — LEVEMIR FLEXTOUCH 100 UNIT/ML ~~LOC~~ SOPN: 10.0000 [IU] | PEN_INJECTOR | Freq: Every day | SUBCUTANEOUS | 5 refills | Status: DC

## 2021-07-07 NOTE — Progress Notes (Signed)
ROB - TDAP, no concerns. RM 4 

## 2021-07-07 NOTE — Progress Notes (Signed)
Routine Prenatal Care Visit  Subjective  Jennifer Pearson is a 35 y.o. G2P0010 at [redacted]w[redacted]d being seen today for ongoing prenatal care.  She is currently monitored for the following issues for this low-risk pregnancy and has Supervision of other normal pregnancy, antepartum; Multigravida of advanced maternal age in second trimester; and Diet controlled gestational diabetes mellitus (GDM) in third trimester on their problem list.  ----------------------------------------------------------------------------------- Patient reports no complaints.   Contractions: Not present. Vag. Bleeding: None.  Movement: Present. Denies leaking of fluid.  ----------------------------------------------------------------------------------- The following portions of the patient's history were reviewed and updated as appropriate: allergies, current medications, past family history, past medical history, past social history, past surgical history and problem list. Problem list updated.   Objective  Blood pressure 120/72, weight 193 lb (87.5 kg), last menstrual period 11/13/2020. Pregravid weight 152 lb (68.9 kg) Total Weight Gain 41 lb (18.6 kg) Urinalysis:      Fetal Status: Fetal Heart Rate (bpm): 150 Fundal Height: 33 cm Movement: Present     General:  Alert, oriented and cooperative. Patient is in no acute distress.  Skin: Skin is warm and dry. No rash noted.   Cardiovascular: Normal heart rate noted  Respiratory: Normal respiratory effort, no problems with respiration noted  Abdomen: Soft, gravid, appropriate for gestational age. Pain/Pressure: Absent     Pelvic:  Cervical exam deferred        Extremities: Normal range of motion.     ental Status: Normal mood and affect. Normal behavior. Normal judgment and thought content.     Assessment   35 y.o. G2P0010 at [redacted]w[redacted]d by  08/20/2021, by Last Menstrual Period presenting for routine prenatal visit  Plan   pregnancy 1 Problems (from 01/07/21 to present)      Problem Noted Resolved   Diet controlled gestational diabetes mellitus (GDM) in third trimester 06/20/2021 by Nadara Mustard, MD No   Overview Signed 06/20/2021 11:11 AM by Nadara Mustard, MD    Current Diabetic Medications:  None  [x ] Aspirin 81 mg daily after 12 weeks; discontinue after 36 weeks (? A2/B GDM)  Required Referrals for A1GDM or A2GDM: [ ]  Diabetes Education and Testing Supplies [ ]  Nutrition Cousult  For A2/B GDM or higher classes of DM [ ]  Diabetes Education and Testing Supplies [ ]  Nutrition Counsult [ ]  Fetal ECHO after 22-24 weeks  [ ]  Eye exam for retina evaluation  [ ]  Baseline EKG [ ]  fetal growth every 4 weeks starting at 28 weeks [ ]  Twice weekly NST starting at [redacted] weeks gestation [ ]  Delivery planning contingent on fetal growth, AFI, glycemic control, and other co-morbidities but at least by 39 weeks  Baseline and surveillance labs (pulled in from EPIC, refresh links as needed)  No results found for: CREATININE, AST, ALT, TSH, PROTCRRATIO, LABPROT, PROTEIN24HR No results found for: HGBA1C  Antenatal Testing Class of DM U/S NST/AFI DELIVERY  Diabetes   A1 - good control - O24.410    A2 - good control - O24.419      A2  - poor control or poor compliance - O24.419, E11.65   (Macrosomia or polyhydramnios) **E11.65 is extra code for poor control**    A2/B - O24.919  and B-C O24.319  Poor control B-C or D-R-F-T - O24.319  or  Type I DM - O24.019  20-38  20-38  20-24-28-32-36   20-24-28-32-35-38//fetal echo  20-24-27-30-33-36-38//fetal echo  40  32//2 x wk  32//2 x wk  32//2 x wk  28//BPP wkly then 32//2 x wk  40  39  PRN   39  PRN           Supervision of other normal pregnancy, antepartum 01/07/2021 by Mirna Mires, CNM No   Overview Addendum 06/20/2021 11:23 AM by Nadara Mustard, MD     [facility] Prenatal Labs  Dating L=11 Blood type: A/Positive/-- (06/24 1224)   Genetic Screen NIPS: diploid  XX Antibody:Negative (06/24 1224)  Anatomic [place] complete Rubella: 4.59 (06/24 1224)  GTT Third trimester:  RPR: Non Reactive (06/24 1224)   Flu vaccine 04/25/21 HBsAg: Negative (06/24 1224)   TDaP vaccine  plan nv                                              Rhogam: n/a HIV: Non Reactive (06/24 1224)   Baby Food             Breast                                  GBS: (For PCN allergy, check sensitivities)  Contraception             pop [id number]  Circumcision    Pediatrician    Support Person     Pt has history of Elective abortion. Please do NOT mention this to her husband or family at any time.              Gestational age appropriate obstetric precautions including but not limited to vaginal bleeding, contractions, leaking of fluid and fetal movement were reviewed in detail with the patient.    1) GDM - BG log reviewed with fasting consistently above 95.  Meal values are within range. Discussed addition of oral medications vs insulin with patient amenable to insulin - start APT at next visit - growth scan sometime in the next week  Return in about 1 week (around 07/14/2021) for ROB MD only.  Vena Austria, MD, [facility], [facility] 07/07/2021, 12:18 PM

## 2021-07-14 ENCOUNTER — Other Ambulatory Visit: Payer: Self-pay

## 2021-07-14 ENCOUNTER — Ambulatory Visit (INDEPENDENT_AMBULATORY_CARE_PROVIDER_SITE_OTHER): Payer: BC Managed Care – PPO | Admitting: Obstetrics and Gynecology

## 2021-07-14 VITALS — BP 118/68 | Wt 195.0 lb

## 2021-07-14 DIAGNOSIS — Z3480 Encounter for supervision of other normal pregnancy, unspecified trimester: Secondary | ICD-10-CM

## 2021-07-14 LAB — FETAL NONSTRESS TEST

## 2021-07-14 NOTE — Progress Notes (Signed)
Routine Prenatal Care Visit  Subjective  Jennifer Pearson is a 35 y.o. G2P0010 at [redacted]w[redacted]d being seen today for ongoing prenatal care.  She is currently monitored for the following issues for this high-risk pregnancy and has Supervision of other normal pregnancy, antepartum; Multigravida of advanced maternal age in second trimester; and Diet controlled gestational diabetes mellitus (GDM) in third trimester on their problem list.  ----------------------------------------------------------------------------------- Patient reports no complaints.    .  .   . Denies leaking of fluid.  ----------------------------------------------------------------------------------- The following portions of the patient's history were reviewed and updated as appropriate: allergies, current medications, past family history, past medical history, past social history, past surgical history and problem list. Problem list updated.   Objective  Blood pressure 118/68, weight 195 lb (88.5 kg), last menstrual period 11/13/2020. Pregravid weight 152 lb (68.9 kg) Total Weight Gain 43 lb (19.5 kg) Urinalysis:      Fetal Status:           General:  Alert, oriented and cooperative. Patient is in no acute distress.  Skin: Skin is warm and dry. No rash noted.   Cardiovascular: Normal heart rate noted  Respiratory: Normal respiratory effort, no problems with respiration noted  Abdomen: Soft, gravid, appropriate for gestational age.       Pelvic:  Cervical exam deferred        Extremities: Normal range of motion.     ental Status: Normal mood and affect. Normal behavior. Normal judgment and thought content.   Baseline: 140 Variability: moderate Accelerations: present Decelerations: absent Tocometry: N/A The patient was monitored for 30 minutes, fetal heart rate tracing was deemed reactive, category I tracing,  Date Fasting Breakfast Lunch Dinner  11/29 108 114 101 141  11/30 102 141 75 113  12/1 104 95 121 103  12/2  104 92 144 140  12/3 101 94  98  12/4 107 108 97 117  12/5 103 99       Assessment   35 y.o. G2P0010 at [redacted]w[redacted]d by  08/20/2021, by Last Menstrual Period presenting for routine prenatal visit  Plan   pregnancy 1 Problems (from 01/07/21 to present)     Problem Noted Resolved   Diet controlled gestational diabetes mellitus (GDM) in third trimester 06/20/2021 by Nadara Mustard, MD No   Overview Signed 06/20/2021 11:11 AM by Nadara Mustard, MD    Current Diabetic Medications:  None  [x ] Aspirin 81 mg daily after 12 weeks; discontinue after 36 weeks (? A2/B GDM)  Required Referrals for A1GDM or A2GDM: [ ]  Diabetes Education and Testing Supplies [ ]  Nutrition Cousult  For A2/B GDM or higher classes of DM [ ]  Diabetes Education and Testing Supplies [ ]  Nutrition Counsult [ ]  Fetal ECHO after 22-24 weeks  [ ]  Eye exam for retina evaluation  [ ]  Baseline EKG [ ]  US fetal growth every 4 weeks starting at 28 weeks [ ]  Twice weekly NST starting at [redacted] weeks gestation [ ]  Delivery planning contingent on fetal growth, AFI, glycemic control, and other co-morbidities but at least by 39 weeks  Baseline and surveillance labs (pulled in from EPIC, refresh links as needed)  No results found for: CREATININE, AST, ALT, TSH, PROTCRRATIO, LABPROT, PROTEIN24HR No results found for: HGBA1C  Antenatal Testing Class of DM U/S NST/AFI DELIVERY  Diabetes   A1 - good control - O24.410    A2 - good control - O24.419      A2  - poor  control or poor compliance - O24.419, E11.65   (Macrosomia or polyhydramnios) **E11.65 is extra code for poor control**    A2/B - O24.919  and B-C O24.319  Poor control B-C or D-R-F-T - O24.319  or  Type I DM - O24.019  20-38  20-38  20-24-28-32-36   20-24-28-32-35-38//fetal echo  20-24-27-30-33-36-38//fetal echo  40  32//2 x wk  32//2 x wk   32//2 x wk  28//BPP wkly then 32//2 x wk  40  39  PRN   39  PRN           Supervision of  other normal pregnancy, antepartum 01/07/2021 by Mirna Mires, CNM No   Overview Addendum 06/20/2021 11:23 AM by Nadara Mustard, MD     [facility] Prenatal Labs  Dating L=11 Blood type: A/Positive/-- (06/24 1224)   Genetic Screen NIPS: diploid XX Antibody:Negative (06/24 1224)  Anatomic [place] complete Rubella: 4.59 (06/24 1224)  GTT Third trimester:  RPR: Non Reactive (06/24 1224)   Flu vaccine 04/25/21 HBsAg: Negative (06/24 1224)   TDaP vaccine  plan nv                                              Rhogam: n/a HIV: Non Reactive (06/24 1224)   Baby Food             Breast                                  GBS: (For PCN allergy, check sensitivities)  Contraception             pop [id number]  Circumcision    Pediatrician    Support Person     Pt has history of Elective abortion. Please do NOT mention this to her husband or family at any time.              Gestational age appropriate obstetric precautions including but not limited to vaginal bleeding, contractions, leaking of fluid and fetal movement were reviewed in detail with the patient.    1) GDM - meal times remain at goal fasting remain consistently elevated.  Increase Levemir to 15U at bedtime - growth scan tomorrow - continue twice weekly APT  Return in about 4 days (around 07/18/2021) for ROB/NST MD 12/8 or 12/9.  Vena Austria, MD, [facility], [facility] 07/14/2021, 11:25 AM

## 2021-07-14 NOTE — Progress Notes (Signed)
ROB - no concerns. RM 4 °

## 2021-07-15 ENCOUNTER — Encounter: Payer: Self-pay | Admitting: *Deleted

## 2021-07-15 ENCOUNTER — Other Ambulatory Visit: Payer: Self-pay | Admitting: Obstetrics and Gynecology

## 2021-07-15 ENCOUNTER — Ambulatory Visit: Payer: BC Managed Care – PPO | Admitting: *Deleted

## 2021-07-15 ENCOUNTER — Ambulatory Visit: Payer: BC Managed Care – PPO | Attending: Obstetrics and Gynecology

## 2021-07-15 VITALS — BP 135/71 | HR 88

## 2021-07-15 DIAGNOSIS — O24410 Gestational diabetes mellitus in pregnancy, diet controlled: Secondary | ICD-10-CM

## 2021-07-15 DIAGNOSIS — Z3480 Encounter for supervision of other normal pregnancy, unspecified trimester: Secondary | ICD-10-CM

## 2021-07-15 DIAGNOSIS — O09523 Supervision of elderly multigravida, third trimester: Secondary | ICD-10-CM | POA: Diagnosis not present

## 2021-07-15 DIAGNOSIS — Z3A34 34 weeks gestation of pregnancy: Secondary | ICD-10-CM | POA: Insufficient documentation

## 2021-07-15 DIAGNOSIS — O24414 Gestational diabetes mellitus in pregnancy, insulin controlled: Secondary | ICD-10-CM | POA: Diagnosis not present

## 2021-07-15 DIAGNOSIS — O99213 Obesity complicating pregnancy, third trimester: Secondary | ICD-10-CM | POA: Diagnosis not present

## 2021-07-15 DIAGNOSIS — Z363 Encounter for antenatal screening for malformations: Secondary | ICD-10-CM | POA: Diagnosis not present

## 2021-07-15 DIAGNOSIS — O09522 Supervision of elderly multigravida, second trimester: Secondary | ICD-10-CM | POA: Diagnosis present

## 2021-07-16 ENCOUNTER — Encounter: Payer: Self-pay | Admitting: Obstetrics and Gynecology

## 2021-07-17 ENCOUNTER — Other Ambulatory Visit: Payer: Self-pay

## 2021-07-17 ENCOUNTER — Encounter: Payer: Self-pay | Admitting: Obstetrics and Gynecology

## 2021-07-17 ENCOUNTER — Ambulatory Visit (INDEPENDENT_AMBULATORY_CARE_PROVIDER_SITE_OTHER): Payer: BC Managed Care – PPO | Admitting: Obstetrics and Gynecology

## 2021-07-17 VITALS — BP 118/71 | Ht 65.0 in | Wt 198.0 lb

## 2021-07-17 DIAGNOSIS — Z3480 Encounter for supervision of other normal pregnancy, unspecified trimester: Secondary | ICD-10-CM

## 2021-07-17 DIAGNOSIS — Z3A35 35 weeks gestation of pregnancy: Secondary | ICD-10-CM

## 2021-07-17 DIAGNOSIS — O24414 Gestational diabetes mellitus in pregnancy, insulin controlled: Secondary | ICD-10-CM

## 2021-07-17 DIAGNOSIS — O09523 Supervision of elderly multigravida, third trimester: Secondary | ICD-10-CM | POA: Diagnosis not present

## 2021-07-17 DIAGNOSIS — O09522 Supervision of elderly multigravida, second trimester: Secondary | ICD-10-CM

## 2021-07-17 NOTE — Patient Instructions (Addendum)
Third Trimester of Pregnancy The third trimester of pregnancy is from week 28 through week 40. This is months 7 through 9. The third trimester is a time when the unborn baby (fetus) is growing rapidly. At the end of the ninth month, the fetus is about 20 inches long and weighs 6-10 pounds. Body changes during your third trimester During the third trimester, your body will continue to go through many changes. The changes vary and generally return to normal after your baby is born. Physical changes Your weight will continue to increase. You can expect to gain 25-35 pounds (11-16 kg) by the end of the pregnancy if you begin pregnancy at a normal weight. If you are underweight, you can expect to gain 28-40 lb (about 13-18 kg), and if you are overweight, you can expect to gain 15-25 lb (about 7-11 kg). You may begin to get stretch marks on your hips, abdomen, and breasts. Your breasts will continue to grow and may hurt. A yellow fluid (colostrum) may leak from your breasts. This is the first milk you are producing for your baby. You may have changes in your hair. These can include thickening of your hair, rapid growth, and changes in texture. Some people also have hair loss during or after pregnancy, or hair that feels dry or thin. Your belly button may stick out. You may notice more swelling in your hands, face, or ankles. Health changes You may have heartburn. You may have constipation. You may develop hemorrhoids. You may develop swollen, bulging veins (varicose veins) in your legs. You may have increased body aches in the pelvis, back, or thighs. This is due to weight gain and increased hormones that are relaxing your joints. You may have increased tingling or numbness in your hands, arms, and legs. The skin on your abdomen may also feel numb. You may feel short of breath because of your expanding uterus. Other changes You may urinate more often because the fetus is moving lower into your pelvis  and pressing on your bladder. You may have more problems sleeping. This may be caused by the size of your abdomen, an increased need to urinate, and an increase in your body's metabolism. You may notice the fetus "dropping," or moving lower in your abdomen (lightening). You may have increased vaginal discharge. You may notice that you have pain around your pelvic bone as your uterus distends. Follow these instructions at home: Medicines Follow your health care provider's instructions regarding medicine use. Specific medicines may be either safe or unsafe to take during pregnancy. Do not take any medicines unless approved by your health care provider. Take a prenatal vitamin that contains at least 600 micrograms (mcg) of folic acid. Eating and drinking Eat a healthy diet that includes fresh fruits and vegetables, whole grains, good sources of protein such as meat, eggs, or tofu, and low-fat dairy products. Avoid raw meat and unpasteurized juice, milk, and cheese. These carry germs that can harm you and your baby. Eat 4 or 5 small meals rather than 3 large meals a day. You may need to take these actions to prevent or treat constipation: Drink enough fluid to keep your urine pale yellow. Eat foods that are high in fiber, such as beans, whole grains, and fresh fruits and vegetables. Limit foods that are high in fat and processed sugars, such as fried or sweet foods. Activity Exercise only as directed by your health care provider. Most people can continue their usual exercise routine during pregnancy. Try to   exercise for 30 minutes at least 5 days a week. Stop exercising if you experience contractions in the uterus. Stop exercising if you develop pain or cramping in the lower abdomen or lower back. Avoid heavy lifting. Do not exercise if it is very hot or humid or if you are at a high altitude. If you choose to, you may continue to have sex unless your health care provider tells you not  to. Relieving pain and discomfort Take frequent breaks and rest with your legs raised (elevated) if you have leg cramps or low back pain. Take warm sitz baths to soothe any pain or discomfort caused by hemorrhoids. Use hemorrhoid cream if your health care provider approves. Wear a supportive bra to prevent discomfort from breast tenderness. If you develop varicose veins: Wear support hose as told by your health care provider. Elevate your feet for 15 minutes, 3-4 times a day. Limit salt in your diet. Safety Talk to your health care provider before traveling far distances. Do not use hot tubs, steam rooms, or saunas. Wear your seat belt at all times when driving or riding in a car. Talk with your health care provider if someone is verbally or physically abusive to you. Preparing for birth To prepare for the arrival of your baby: Take prenatal classes to understand, practice, and ask questions about labor and delivery. Visit the hospital and tour the maternity area. Purchase a rear-facing car seat and make sure you know how to install it in your car. Prepare the baby's room or sleeping area. Make sure to remove all pillows and stuffed animals from the baby's crib to prevent suffocation. General instructions Avoid cat litter boxes and soil used by cats. These carry germs that can cause birth defects in the baby. If you have a cat, ask someone to clean the litter box for you. Do not douche or use tampons. Do not use scented sanitary pads. Do not use any products that contain nicotine or tobacco, such as cigarettes, e-cigarettes, and chewing tobacco. If you need help quitting, ask your health care provider. Do not use any herbal remedies, illegal drugs, or medicines that were not prescribed to you. Chemicals in these products can harm your baby. Do not drink alcohol. You will have more frequent prenatal exams during the third trimester. During a routine prenatal visit, your health care provider  will do a physical exam, perform tests, and discuss your overall health. Keep all follow-up visits. This is important. Where to find more information American Pregnancy Association: americanpregnancy.org American College of Obstetricians and Gynecologists: acog.org/en/Womens%20Health/Pregnancy Office on Women's Health: womenshealth.gov/pregnancy Contact a health care provider if you have: A fever. Mild pelvic cramps, pelvic pressure, or nagging pain in your abdominal area or lower back. Vomiting or diarrhea. Bad-smelling vaginal discharge or foul-smelling urine. Pain when you urinate. A headache that does not go away when you take medicine. Visual changes or see spots in front of your eyes. Get help right away if: Your water breaks. You have regular contractions less than 5 minutes apart. You have spotting or bleeding from your vagina. You have severe abdominal pain. You have difficulty breathing. You have chest pain. You have fainting spells. You have not felt your baby move for the time period told by your health care provider. You have new or increased pain, swelling, or redness in an arm or leg. Summary The third trimester of pregnancy is from week 28 through week 40 (months 7 through 9). You may have more problems sleeping.   This can be caused by the size of your abdomen, an increased need to urinate, and an increase in your body's metabolism. You will have more frequent prenatal exams during the third trimester. Keep all follow-up visits. This is important. This information is not intended to replace advice given to you by your health care provider. Make sure you discuss any questions you have with your health care provider. Document Revised: 01/03/2020 Document Reviewed: 11/09/2019 Elsevier Patient Education  2022 Elsevier Inc. Diabetes Mellitus and Nutrition, Adult When you have diabetes, or diabetes mellitus, it is very important to have healthy eating habits because your  blood sugar (glucose) levels are greatly affected by what you eat and drink. Eating healthy foods in the right amounts, at about the same times every day, can help you: Manage your blood glucose. Lower your risk of heart disease. Improve your blood pressure. Reach or maintain a healthy weight. What can affect my meal plan? Every person with diabetes is different, and each person has different needs for a meal plan. Your health care provider may recommend that you work with a dietitian to make a meal plan that is best for you. Your meal plan may vary depending on factors such as: The calories you need. The medicines you take. Your weight. Your blood glucose, blood pressure, and cholesterol levels. Your activity level. Other health conditions you have, such as heart or kidney disease. How do carbohydrates affect me? Carbohydrates, also called carbs, affect your blood glucose level more than any other type of food. Eating carbs raises the amount of glucose in your blood. It is important to know how many carbs you can safely have in each meal. This is different for every person. Your dietitian can help you calculate how many carbs you should have at each meal and for each snack. How does alcohol affect me? Alcohol can cause a decrease in blood glucose (hypoglycemia), especially if you use insulin or take certain diabetes medicines by mouth. Hypoglycemia can be a life-threatening condition. Symptoms of hypoglycemia, such as sleepiness, dizziness, and confusion, are similar to symptoms of having too much alcohol. Do not drink alcohol if: Your health care provider tells you not to drink. You are pregnant, may be pregnant, or are planning to become pregnant. If you drink alcohol: Limit how much you have to: 0-1 drink a day for women. 0-2 drinks a day for men. Know how much alcohol is in your drink. In the [place] one drink equals one 12 oz bottle of beer (355 mL), one 5 oz glass of wine (148 mL), or  one 1 oz glass of hard liquor (44 mL). Keep yourself hydrated with water, diet soda, or unsweetened iced tea. Keep in mind that regular soda, juice, and other mixers may contain a lot of sugar and must be counted as carbs. What are tips for following this plan? Reading food labels Start by checking the serving size on the Nutrition Facts label of packaged foods and drinks. The number of calories and the amount of carbs, fats, and other nutrients listed on the label are based on one serving of the item. Many items contain more than one serving per package. Check the total grams (g) of carbs in one serving. Check the number of grams of saturated fats and trans fats in one serving. Choose foods that have a low amount or none of these fats. Check the number of milligrams (mg) of salt (sodium) in one serving. Most people should limit total sodium intake  to less than 2,300 mg per day. Always check the nutrition information of foods labeled as "low-fat" or "nonfat." These foods may be higher in added sugar or refined carbs and should be avoided. Talk to your dietitian to identify your daily goals for nutrients listed on the label. Shopping Avoid buying canned, pre-made, or processed foods. These foods tend to be high in fat, sodium, and added sugar. Shop around the outside edge of the grocery store. This is where you will most often find fresh fruits and vegetables, bulk grains, fresh meats, and fresh dairy products. Cooking Use low-heat cooking methods, such as baking, instead of high-heat cooking methods, such as deep frying. Cook using healthy oils, such as olive, canola, or sunflower oil. Avoid cooking with butter, cream, or high-fat meats. Meal planning Eat meals and snacks regularly, preferably at the same times every day. Avoid going long periods of time without eating. Eat foods that are high in fiber, such as fresh fruits, vegetables, beans, and whole grains. Eat 4-6 oz (112-168 g) of lean  protein each day, such as lean meat, chicken, fish, eggs, or tofu. One ounce (oz) (28 g) of lean protein is equal to: 1 oz (28 g) of meat, chicken, or fish. 1 egg.  cup (62 g) of tofu. Eat some foods each day that contain healthy fats, such as avocado, nuts, seeds, and fish. What foods should I eat? Fruits Berries. Apples. Oranges. Peaches. Apricots. Plums. Grapes. Mangoes. Papayas. Pomegranates. Kiwi. Cherries. Vegetables Leafy greens, including lettuce, spinach, kale, chard, collard greens, mustard greens, and cabbage. Beets. Cauliflower. Broccoli. Carrots. Green beans. Tomatoes. Peppers. Onions. Cucumbers. Brussels sprouts. Grains Whole grains, such as whole-wheat or whole-grain bread, crackers, tortillas, cereal, and pasta. Unsweetened oatmeal. Quinoa. Brown or wild rice. Meats and other proteins Seafood. Poultry without skin. Lean cuts of poultry and beef. Tofu. Nuts. Seeds. Dairy Low-fat or fat-free dairy products such as milk, yogurt, and cheese. The items listed above may not be a complete list of foods and beverages you can eat and drink. Contact a dietitian for more information. What foods should I avoid? Fruits Fruits canned with syrup. Vegetables Canned vegetables. Frozen vegetables with butter or cream sauce. Grains Refined white flour and flour products such as bread, pasta, snack foods, and cereals. Avoid all processed foods. Meats and other proteins Fatty cuts of meat. Poultry with skin. Breaded or fried meats. Processed meat. Avoid saturated fats. Dairy Full-fat yogurt, cheese, or milk. Beverages Sweetened drinks, such as soda or iced tea. The items listed above may not be a complete list of foods and beverages you should avoid. Contact a dietitian for more information. Questions to ask a health care provider Do I need to meet with a certified diabetes care and education specialist? Do I need to meet with a dietitian? What number can I call if I have  questions? When are the best times to check my blood glucose? Where to find more information: American Diabetes Association: diabetes.org Academy of Nutrition and Dietetics: eatright.[employer] of Diabetes and Digestive and Kidney Diseases: StageSync.si Association of Diabetes Care & Education Specialists: diabeteseducator.org Summary It is important to have healthy eating habits because your blood sugar (glucose) levels are greatly affected by what you eat and drink. It is important to use alcohol carefully. A healthy meal plan will help you manage your blood glucose and lower your risk of heart disease. Your health care provider may recommend that you work with a dietitian to make a meal plan that  is best for you. This information is not intended to replace advice given to you by your health care provider. Make sure you discuss any questions you have with your health care provider. Document Revised: 02/28/2020 Document Reviewed: 02/28/2020 Elsevier Patient Education  2022 [employer].

## 2021-07-17 NOTE — Progress Notes (Signed)
Routine Prenatal Care Visit  Subjective  Jennifer Pearson is a 35 y.o. G2P0010 at [redacted]w[redacted]d being seen today for ongoing prenatal care.  She is currently monitored for the following issues for this high-risk pregnancy and has Supervision of other normal pregnancy, antepartum; Multigravida of advanced maternal age in second trimester; and Diet controlled gestational diabetes mellitus (GDM) in third trimester on their problem list.  ----------------------------------------------------------------------------------- Patient reports no complaints.   Contractions: Irregular. Vag. Bleeding: None.  Movement: Present. Denies leaking of fluid.  ----------------------------------------------------------------------------------- The following portions of the patient's history were reviewed and updated as appropriate: allergies, current medications, past family history, past medical history, past social history, past surgical history and problem list. Problem list updated.   Objective  Blood pressure 118/71, height 5\' 5"  (1.651 m), weight 198 lb (89.8 kg), last menstrual period 11/13/2020. Pregravid weight 152 lb (68.9 kg) Total Weight Gain 46 lb (20.9 kg) Urinalysis:      Fetal Status:     Movement: Present     General:  Alert, oriented and cooperative. Patient is in no acute distress.  Skin: Skin is warm and dry. No rash noted.   Cardiovascular: Normal heart rate noted  Respiratory: Normal respiratory effort, no problems with respiration noted  Abdomen: Soft, gravid, appropriate for gestational age. Pain/Pressure: Absent     Pelvic:  Cervical exam deferred        Extremities: Normal range of motion.     Mental Status: Normal mood and affect. Normal behavior. Normal judgment and thought content.     Assessment   35 y.o. G2P0010 at [redacted]w[redacted]d by  08/20/2021, by Last Menstrual Period presenting for routine prenatal visit  Plan   pregnancy 1 Problems (from 01/07/21 to present)     Problem Noted  Resolved   Diet controlled gestational diabetes mellitus (GDM) in third trimester 06/20/2021 by 13/06/2021, MD No   Overview Signed 06/20/2021 11:11 AM by 13/06/2021, MD    Current Diabetic Medications:  None  [x ] Aspirin 81 mg daily after 12 weeks; discontinue after 36 weeks (? A2/B GDM)  Required Referrals for A1GDM or A2GDM: [ ]  Diabetes Education and Testing Supplies [ ]  Nutrition Cousult  For A2/B GDM or higher classes of DM [ ]  Diabetes Education and Testing Supplies [ ]  Nutrition Counsult [ ]  Fetal ECHO after 22-24 weeks  [ ]  Eye exam for retina evaluation  [ ]  Baseline EKG [ ]  Nadara Mustard fetal growth every 4 weeks starting at 28 weeks [ ]  Twice weekly NST starting at [redacted] weeks gestation [ ]  Delivery planning contingent on fetal growth, AFI, glycemic control, and other co-morbidities but at least by 39 weeks  Baseline and surveillance labs (pulled in from EPIC, refresh links as needed)  No results found for: CREATININE, AST, ALT, TSH, PROTCRRATIO, LABPROT, PROTEIN24HR No results found for: HGBA1C  Antenatal Testing Class of DM U/S NST/AFI DELIVERY  Diabetes   A1 - good control - O24.410    A2 - good control - O24.419      A2  - poor control or poor compliance - O24.419, E11.65   (Macrosomia or polyhydramnios) **E11.65 is extra code for poor control**    A2/B - O24.919  and B-C O24.319  Poor control B-C or D-R-F-T - O24.319  or  Type I DM - O24.019  20-38  20-38  20-24-28-32-36   20-24-28-32-35-38//fetal echo  20-24-27-30-33-36-38//fetal echo  40  32//2 x wk  32//2 x wk   32//2  x wk  28//BPP wkly then 32//2 x wk  40  39  PRN   39  PRN           Supervision of other normal pregnancy, antepartum 01/07/2021 by Imagene Riches, CNM No   Overview Addendum 06/20/2021 11:23 AM by Gae Dry, MD     Clinic [facility] Prenatal Labs  Dating L=11 Blood type: A/Positive/-- (06/24 1224)   Genetic 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 Please do NOT mention this to her husband or family at any time.                NST: 140 bpm baseline, moderate variability, 15x15 accelerations, no decelerations. Reactive  She has increased from 15 to 20 units at night. Continue to monitor. Discussed dietary differences in dinners with values ranging from 90s to 150s.     Gestational age appropriate obstetric precautions including but not limited to vaginal bleeding, contractions, leaking of fluid and fetal movement were reviewed in detail with the patient.    Return in about 1 week (around 07/24/2021) for HROB/NST with MD.  Homero Fellers MD [place] 07/17/2021, 3:50 PM

## 2021-07-21 ENCOUNTER — Encounter: Payer: Self-pay | Admitting: Obstetrics & Gynecology

## 2021-07-21 ENCOUNTER — Other Ambulatory Visit: Payer: Self-pay

## 2021-07-21 ENCOUNTER — Ambulatory Visit (INDEPENDENT_AMBULATORY_CARE_PROVIDER_SITE_OTHER): Payer: BC Managed Care – PPO | Admitting: Obstetrics & Gynecology

## 2021-07-21 VITALS — BP 120/80 | Wt 197.0 lb

## 2021-07-21 DIAGNOSIS — Z3483 Encounter for supervision of other normal pregnancy, third trimester: Secondary | ICD-10-CM

## 2021-07-21 DIAGNOSIS — O24414 Gestational diabetes mellitus in pregnancy, insulin controlled: Secondary | ICD-10-CM

## 2021-07-21 DIAGNOSIS — Z3A35 35 weeks gestation of pregnancy: Secondary | ICD-10-CM

## 2021-07-21 DIAGNOSIS — O09522 Supervision of elderly multigravida, second trimester: Secondary | ICD-10-CM

## 2021-07-21 LAB — POCT URINALYSIS DIPSTICK OB
Glucose, UA: NEGATIVE
POC,PROTEIN,UA: NEGATIVE

## 2021-07-21 NOTE — Progress Notes (Signed)
Subjective  Fetal Movement? yes Contractions? no Leaking Fluid? no Vaginal Bleeding? no FBS 97-107; Most PPBS <120  BPP and grwoth USlast week reviewed.  EFW 88%.  Objective  BP 120/80   Wt 197 lb (89.4 kg)   LMP 11/13/2020 (Exact Date)   BMI 32.78 kg/m  General: NAD Pumonary: no increased work of breathing Abdomen: gravid, non-tender Extremities: no edema Psychiatric: mood appropriate, affect full  Assessment  35 y.o. G2P0010 at [redacted]w[redacted]d by  08/20/2021, by Last Menstrual Period presenting for routine prenatal visit  Plan   Problem List Items Addressed This Visit      Other   Supervision of other normal pregnancy, antepartum - Primary   Multigravida of advanced maternal age in second trimester  Other Visit Diagnoses    Insulin controlled gestational diabetes mellitus (GDM) in third trimester       [redacted] weeks gestation of pregnancy        Plan GBS nv Discussed pros and cons of IOL, also timing due to [facility]    Plan for 37 weeks Cont Insulin and diet for GDM control    20 U daily PNV, FMC, PTL risks  pregnancy 1 Problems (from 01/07/21 to present)    Problem Noted Resolved   Diet controlled gestational diabetes mellitus (GDM) in third trimester 06/20/2021 by Nadara Mustard, MD No   Overview Signed 06/20/2021 11:11 AM by Nadara Mustard, MD    Current Diabetic Medications:  None  [x ] Aspirin 81 mg daily after 12 weeks; discontinue after 36 weeks (? A2/B GDM)  Required Referrals for A1GDM or A2GDM: [ ]  Diabetes Education and Testing Supplies [ ]  Nutrition Cousult  For A2/B GDM or higher classes of DM [ ]  Diabetes Education and Testing Supplies [ ]  Nutrition Counsult [ ]  Fetal ECHO after 22-24 weeks  [ ]  Eye exam for retina evaluation  [ ]  Baseline EKG [ ]  fetal growth every 4 weeks starting at 28 weeks [ ]  Twice weekly NST starting at [redacted] weeks gestation [ ]  Delivery planning contingent on fetal growth, AFI, glycemic control, and other co-morbidities but at  least by 39 weeks  Baseline and surveillance labs (pulled in from EPIC, refresh links as needed)  No results found for: CREATININE, AST, ALT, TSH, PROTCRRATIO, LABPROT, PROTEIN24HR No results found for: HGBA1C  Antenatal Testing Class of DM U/S NST/AFI DELIVERY  Diabetes   A1 - good control - O24.410    A2 - good control - O24.419      A2  - poor control or poor compliance - O24.419, E11.65   (Macrosomia or polyhydramnios) **E11.65 is extra code for poor control**    A2/B - O24.919  and B-C O24.319  Poor control B-C or D-R-F-T - O24.319  or  Type I DM - O24.019  20-38  20-38  20-24-28-32-36   20-24-28-32-35-38//fetal echo  20-24-27-30-33-36-38//fetal echo  40  32//2 x wk  32//2 x wk   32//2 x wk  28//BPP wkly then 32//2 x wk  40  39  PRN   39  PRN       Supervision of other normal pregnancy, antepartum 01/07/2021 by , CNM No   Overview Addendum 06/20/2021 11:23 AM by , MD     [facility]inic [facility] Prenatal Labs  Dating L=11 Blood type: A/Positive/-- (06/24 1224)   Genetic 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 Please do NOT mention this to her husband or family at any time.      Annamarie Major, MD, [facility], [facility] 07/21/2021  11:37 AM

## 2021-07-21 NOTE — Addendum Note (Signed)
Addended by: Cornelius Moras D on: 07/21/2021 11:44 AM   Modules accepted: Orders

## 2021-07-21 NOTE — Progress Notes (Signed)
  [facility] INDUCTION ASSESSMENT SCHEDULING Jennifer Pearson Mar 08, 1986 Medical record #: [id number] Phone #:  Home Phone [phone]  Mobile [phone]    [facility]atal Provider:[facility] Proposed admission date/time:07/30/21 Method of induction:Cytotec  Weight: Filed Weights12/12/22 1100Weight:197 lb (89.4 kg) BMI Body mass index is 32.78 kg/m. HIV Negative HSV Negative EDC Estimated Date of Delivery: 1/11/23based on:US at [redacted] wks  Gestational age on admission: 37 Gravidity/parity:G2P0010  Cervix Score   0 1 2 3   Position Posterior Midposition Anterior   Consistency Firm Medium Soft   Effacement (%) 0-30 40-50 60-70 >80  Dilation (cm) Closed 1-2 3-4 >5  Baby's station -3 -2 -1 +1, +2   Bishop Score:4   Medical induction of labor  select indication(s) below Elective induction ?39 weeks multiparous patient ?39 weeks primiparous patient with Bishop score ?7 ?40 weeks primiparous patient   Medical Indications Adapted from ACOG Committee Opinion #[id number], "Medically Indicated Late Preterm and Early Term Deliveries," 2013.    MATERNAL ISSUES                                            Gestational, uncontrolled (37.0-39.0)  For indications not listed above, delivery recommendations from maternal-fetal medicine consultant occurred on: Date:07/17/21 with Dr. 14/8/22 for indication of:GDM, poorly controlled, recommended for 37 weeks  Provider Signature: Parke Poisson Scheduled Letitia Libra Date:07/21/2021 11:39 AM   Call [phone] to finalize the induction date/time  [phone] (07/17)

## 2021-07-21 NOTE — Patient Instructions (Signed)
Labor Induction ?Labor induction is when steps are taken to cause a pregnant woman to begin the labor process. Most women go into labor on their own between 37 weeks and 42 weeks of pregnancy. When this does not happen, or when there is a medical need for labor to begin, steps may be taken to induce, or bring on, labor. ?Labor induction causes a pregnant woman's uterus to contract. It also causes the cervix to soften (ripen), open (dilate), and thin out. Usually, labor is not induced before 39 weeks of pregnancy unless there is a medical reason to do so. ?When is labor induction considered? ?Labor induction may be right for you if: ?Your pregnancy lasts longer than 41 to 42 weeks. ?Your placenta is separating from your uterus (placental abruption). ?You have a rupture of membranes and your labor does not begin. ?You have health problems, like diabetes or high blood pressure (preeclampsia) during your pregnancy. ?Your baby has stopped growing or does not have enough amniotic fluid. ?Before labor induction begins, your health care provider will consider the following factors: ?Your medical condition and the baby's condition. ?How many weeks you have been pregnant. ?How mature the baby's lungs are. ?The condition of your cervix. ?The position of the baby. ?The size of your birth canal. ?Tell a health care provider about: ?Any allergies you have. ?All medicines you are taking, including vitamins, herbs, eye drops, creams, and over-the-counter medicines. ?Any problems you or your family members have had with anesthetic medicines. ?Any surgeries you have had. ?Any blood disorders you have. ?Any medical conditions you have. ?What are the risks? ?Generally, this is a safe procedure. However, problems may occur, including: ?Failed induction. ?Changes in fetal heart rate, such as being too high, too low, or irregular (erratic). ?Infection in the mother or the baby. ?Increased risk of having a cesarean delivery. ?Breaking off  (abruption) of the placenta from the uterus. This is rare. ?Rupture of the uterus. This is very rare. ?Your baby could fail to get enough blood flow or oxygen. This can be life-threatening. ?When induction is needed for medical reasons, the benefits generally outweigh the risks. ?What happens during the procedure? ?During the procedure, your health care provider will use one of these methods to induce labor: ?Stripping the membranes. In this method, the amniotic sac tissue is gently separated from the cervix. This causes the following to happen: ?Your cervix stretches, which in turn causes the release of prostaglandins. ?Prostaglandins induce labor and cause the uterus to contract. ?This procedure is often done in an office visit. You will be sent home to wait for contractions to begin. ?Prostaglandin medicine. This medicine starts contractions and causes the cervix to dilate and ripen. This can be taken by mouth (orally) or by being inserted into the vagina (suppository). ?Inserting a small, thin tube (catheter) with a balloon into the vagina and then expanding the balloon with water to dilate the cervix. ?Breaking the water. In this method, a small instrument is used to make a small hole in the amniotic sac. This eventually causes the amniotic sac to break. Contractions should begin within a few hours. ?Medicine to trigger or strengthen contractions. This medicine is given through an IV that is inserted into a vein in your arm. ?This procedure may vary among health care providers and hospitals. ?Where to find more information ?March of Dimes: www.marchofdimes.org ?The American College of Obstetricians and Gynecologists: www.acog.org ?Summary ?Labor induction causes a pregnant woman's uterus to contract. It also causes the cervix   to soften (ripen), open (dilate), and thin out. ?Labor is usually not induced before 39 weeks of pregnancy unless there is a medical reason to do so. ?When induction is needed for medical  reasons, the benefits generally outweigh the risks. ?Talk with your health care provider about which methods of labor induction are right for you. ?This information is not intended to replace advice given to you by your health care provider. Make sure you discuss any questions you have with your health care provider. ?Document Revised: 05/09/2020 Document Reviewed: 05/09/2020 ?Elsevier Patient Education ? 2022 Elsevier Inc. ? ?

## 2021-07-23 ENCOUNTER — Ambulatory Visit (INDEPENDENT_AMBULATORY_CARE_PROVIDER_SITE_OTHER): Payer: BC Managed Care – PPO | Admitting: Obstetrics & Gynecology

## 2021-07-23 ENCOUNTER — Other Ambulatory Visit: Payer: Self-pay

## 2021-07-23 VITALS — BP 120/80 | Wt 198.0 lb

## 2021-07-23 DIAGNOSIS — O24414 Gestational diabetes mellitus in pregnancy, insulin controlled: Secondary | ICD-10-CM | POA: Diagnosis not present

## 2021-07-23 DIAGNOSIS — Z3483 Encounter for supervision of other normal pregnancy, third trimester: Secondary | ICD-10-CM

## 2021-07-23 DIAGNOSIS — O09522 Supervision of elderly multigravida, second trimester: Secondary | ICD-10-CM

## 2021-07-23 DIAGNOSIS — Z3A36 36 weeks gestation of pregnancy: Secondary | ICD-10-CM

## 2021-07-23 DIAGNOSIS — Z3685 Encounter for antenatal screening for Streptococcus B: Secondary | ICD-10-CM

## 2021-07-23 LAB — POCT URINALYSIS DIPSTICK OB
Glucose, UA: NEGATIVE
POC,PROTEIN,UA: NEGATIVE

## 2021-07-23 NOTE — Progress Notes (Signed)
Subjective  Fetal Movement? yes Contractions? no Leaking Fluid? no Vaginal Bleeding? no  Objective  BP 120/80    Wt 198 lb (89.8 kg)    LMP 11/13/2020 (Exact Date)    BMI 32.95 kg/m  General: NAD Pumonary: no increased work of breathing Abdomen: gravid, non-tender Extremities: no edema Psychiatric: mood appropriate, affect full  A NST procedure was performed with FHR monitoring and a normal baseline established, appropriate time of 20-40 minutes of evaluation, and accels >2 seen w 15x15 characteristics.  Results show a REACTIVE NST.   Assessment  35 y.o. G2P0010 at [redacted]w[redacted]d by  08/20/2021, by Last Menstrual Period presenting for routine prenatal visit  Plan   Problem List Items Addressed This Visit      Other   Multigravida of advanced maternal age in second trimester  Other Visit Diagnoses    Encounter for supervision of other normal pregnancy in third trimester    -  Primary   Insulin controlled gestational diabetes mellitus (GDM) in third trimester       [redacted] weeks gestation of pregnancy       Encounter for antenatal screening for Streptococcus B       Relevant Orders   Culture, beta strep (group b only)    Discussed IOL (12/23/0001) PNV, FMC, Labor precautions GDMA2, cont Insulin,risks to baby/pregnancy discussed GBS done  pregnancy 1 Problems (from 01/07/21 to present)    Problem Noted Resolved   Diet controlled gestational diabetes mellitus (GDM) in third trimester 06/20/2021 by Nadara Mustard, MD No   Overview Signed 06/20/2021 11:11 AM by Nadara Mustard, MD    Current Diabetic Medications:  None  [x ] Aspirin 81 mg daily after 12 weeks; discontinue after 36 weeks (? A2/B GDM)  Required Referrals for A1GDM or A2GDM: [ ]  Diabetes Education and Testing Supplies [ ]  Nutrition Cousult  For A2/B GDM or higher classes of DM [ ]  Diabetes Education and Testing Supplies [ ]  Nutrition Counsult [ ]  Fetal ECHO after 22-24 weeks  [ ]  Eye exam for retina evaluation  [  ] Baseline EKG [ ]  fetal growth every 4 weeks starting at 28 weeks [ ]  Twice weekly NST starting at [redacted] weeks gestation [ ]  Delivery planning contingent on fetal growth, AFI, glycemic control, and other co-morbidities but at least by 39 weeks  Baseline and surveillance labs (pulled in from EPIC, refresh links as needed)  No results found for: CREATININE, AST, ALT, TSH, PROTCRRATIO, LABPROT, PROTEIN24HR No results found for: HGBA1C  Antenatal Testing Class of DM U/S NST/AFI DELIVERY  Diabetes   A1 - good control - O24.410    A2 - good control - O24.419      A2  - poor control or poor compliance - O24.419, E11.65   (Macrosomia or polyhydramnios) **E11.65 is extra code for poor control**    A2/B - O24.919  and B-C O24.319  Poor control B-C or D-R-F-T - O24.319  or  Type I DM - O24.019  20-38  20-38  20-24-28-32-36   20-24-28-32-35-38//fetal echo  20-24-27-30-33-36-38//fetal echo  40  32//2 x wk  32//2 x wk   32//2 x wk  28//BPP wkly then 32//2 x wk  40  39  PRN   39  PRN           Supervision of other normal pregnancy, antepartum 01/07/2021 by [place], CNM No   Overview Addendum 06/20/2021 11:23 AM by , MD     Clinic  [facility] Prenatal Labs  Dating L=11 Blood type: A/Positive/-- (06/24 1224)   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 Please do NOT mention this to her husband or family at any time.              Annamarie Major, MD, [facility], [facility] 07/23/2021  2:18 PM

## 2021-07-23 NOTE — Patient Instructions (Signed)
Labor Induction 08/01/21 at 0001 am Labor induction is when steps are taken to cause a pregnant woman to begin the labor process. Most women go into labor on their own between 37 weeks and 42 weeks of pregnancy. When this does not happen, or when there is a medical need for labor to begin, steps may be taken to induce, or bring on, labor. Labor induction causes a pregnant woman's uterus to contract. It also causes the cervix to soften (ripen), open (dilate), and thin out. Usually, labor is not induced before 39 weeks of pregnancy unless there is a medical reason to do so. When is labor induction considered? Labor induction may be right for you if: Your pregnancy lasts longer than 41 to 42 weeks. Your placenta is separating from your uterus (placental abruption). You have a rupture of membranes and your labor does not begin. You have health problems, like diabetes or high blood pressure (preeclampsia) during your pregnancy. Your baby has stopped growing or does not have enough amniotic fluid. Before labor induction begins, your health care provider will consider the following factors: Your medical condition and the baby's condition. How many weeks you have been pregnant. How mature the baby's lungs are. The condition of your cervix. The position of the baby. The size of your birth canal. Tell a health care provider about: Any allergies you have. All medicines you are taking, including vitamins, herbs, eye drops, creams, and over-the-counter medicines. Any problems you or your family members have had with anesthetic medicines. Any surgeries you have had. Any blood disorders you have. Any medical conditions you have. What are the risks? Generally, this is a safe procedure. However, problems may occur, including: Failed induction. Changes in fetal heart rate, such as being too high, too low, or irregular (erratic). Infection in the mother or the baby. Increased risk of having a cesarean  delivery. Breaking off (abruption) of the placenta from the uterus. This is rare. Rupture of the uterus. This is very rare. Your baby could fail to get enough blood flow or oxygen. This can be life-threatening. When induction is needed for medical reasons, the benefits generally outweigh the risks. What happens during the procedure? During the procedure, your health care provider will use one of these methods to induce labor: Stripping the membranes. In this method, the amniotic sac tissue is gently separated from the cervix. This causes the following to happen: Your cervix stretches, which in turn causes the release of prostaglandins. Prostaglandins induce labor and cause the uterus to contract. This procedure is often done in an office visit. You will be sent home to wait for contractions to begin. Prostaglandin medicine. This medicine starts contractions and causes the cervix to dilate and ripen. This can be taken by mouth (orally) or by being inserted into the vagina (suppository). Inserting a small, thin tube (catheter) with a balloon into the vagina and then expanding the balloon with water to dilate the cervix. Breaking the water. In this method, a small instrument is used to make a small hole in the amniotic sac. This eventually causes the amniotic sac to break. Contractions should begin within a few hours. Medicine to trigger or strengthen contractions. This medicine is given through an IV that is inserted into a vein in your arm. This procedure may vary among health care providers and hospitals. Where to find more information March of Dimes: www.marchofdimes.org [employer] of [employer]st[employer]ians and Gynecologists: www.acog.org Summary Labor induction causes a pregnant woman's uterus to contract. It  also causes the cervix to soften (ripen), open (dilate), and thin out. Labor is usually not induced before 39 weeks of pregnancy unless there is a medical reason to do so. When  induction is needed for medical reasons, the benefits generally outweigh the risks. Talk with your health care provider about which methods of labor induction are right for you. This information is not intended to replace advice given to you by your health care provider. Make sure you discuss any questions you have with your health care provider. Document Revised: 05/09/2020 Document Reviewed: 05/09/2020 Elsevier Patient Education  2022 [employer].

## 2021-07-28 ENCOUNTER — Ambulatory Visit (INDEPENDENT_AMBULATORY_CARE_PROVIDER_SITE_OTHER): Payer: BC Managed Care – PPO | Admitting: Obstetrics and Gynecology

## 2021-07-28 ENCOUNTER — Encounter: Payer: BC Managed Care – PPO | Admitting: Obstetrics and Gynecology

## 2021-07-28 ENCOUNTER — Other Ambulatory Visit: Payer: Self-pay

## 2021-07-28 VITALS — BP 150/80 | Wt 201.0 lb

## 2021-07-28 DIAGNOSIS — Z01818 Encounter for other preprocedural examination: Secondary | ICD-10-CM | POA: Diagnosis not present

## 2021-07-28 DIAGNOSIS — O24410 Gestational diabetes mellitus in pregnancy, diet controlled: Secondary | ICD-10-CM

## 2021-07-28 DIAGNOSIS — Z3480 Encounter for supervision of other normal pregnancy, unspecified trimester: Secondary | ICD-10-CM

## 2021-07-28 DIAGNOSIS — O09522 Supervision of elderly multigravida, second trimester: Secondary | ICD-10-CM | POA: Diagnosis not present

## 2021-07-28 DIAGNOSIS — O163 Unspecified maternal hypertension, third trimester: Secondary | ICD-10-CM

## 2021-07-28 LAB — CULTURE, BETA STREP (GROUP B ONLY): Strep Gp B Culture: POSITIVE — AB

## 2021-07-28 NOTE — Progress Notes (Signed)
Obstetric H&P   Chief Complaint: ROB, NST  Prenatal Care Provider: [facility]  History of Present Illness: 35 y.o. G2P0010 31w5dby 08/20/2021, by Last Menstrual Period presenting to for schedule NST today.  She does not have her BG log with her today but reports no significant changes in her BG values.  Fasting values remain mildly elevated.  EFW 2948g or 6lbs 8oz 88%ile with AC 98%ile on 07/15/2021.  She is scheduled for IOL 08/01/21.  Reports +FM, no LOF, no VB, no ctx.  BP today elevated into mild range.  The patient denies HA, vision changes, RUQ/epigastric pain, or increased edema.    Pregravid weight 152 lb (68.9 kg) Total Weight Gain 49 lb (22.2 kg)  pregnancy 1 Problems (from 01/07/21 to present)     Problem Noted Resolved   Diet controlled gestational diabetes mellitus (GDM) in third trimester 06/20/2021 by HGae Dry MD No   Overview Signed 06/20/2021 11:11 AM by HGae Dry MD    Current Diabetic Medications:  None  [x ] Aspirin 81 mg daily after 12 weeks; discontinue after 36 weeks (? A2/B GDM)  Required Referrals for A1GDM or A2GDM: '[ ]'  Diabetes Education and Testing Supplies '[ ]'  Nutrition Cousult  For A2/B GDM or higher classes of DM '[ ]'  Diabetes Education and Testing Supplies '[ ]'  Nutrition Counsult '[ ]'  Fetal ECHO after 22-24 weeks  '[ ]'  Eye exam for retina evaluation  '[ ]'  Baseline EKG '[ ]'  UKoreafetal growth every 4 weeks starting at 28 weeks '[ ]'  Twice weekly NST starting at [redacted] weeks gestation '[ ]'  Delivery planning contingent on fetal growth, AFI, glycemic control, and other co-morbidities but at least by 39 weeks  Baseline and surveillance labs (pulled in from EPIC, refresh links as needed)  No results found for: CREATININE, AST, ALT, TSH, PROTCRRATIO, LABPROT, PROTEIN24HR No results found for: HGBA1C  Antenatal Testing Class of DM U/S NST/AFI DELIVERY  Diabetes   A1 - good control - O24.410    A2 - good control - O24.419      A2  - poor control or  poor compliance - O24.419, E11.65   (Macrosomia or polyhydramnios) **E11.65 is extra code for poor control**    A2/B - O24.919  and B-C O24.319  Poor control B-C or D-R-F-T - O24.319  or  Type I DM - O24.019  20-38  20-38  20-24-28-32-36   20-24-28-32-35-38//fetal echo  20-24-27-30-33-36-38//fetal echo  40  32//2 x wk  32//2 x wk   32//2 x wk  28//BPP wkly then 32//2 x wk  40  39  PRN   39  PRN           Supervision of other normal pregnancy, antepartum 01/07/2021 by FImagene Riches CNM No   Overview Addendum 06/20/2021 11:23 AM by HGae Dry MD     [facility]ren[facility]  Dating L=11 Blood type: A/Positive/-- (06/24 1224)   Genetic Screen NIPS: diploid XX Antibody:Negative (06/24 1224)  Anatomic UKoreacomplete Rubella: 4.59 (06/24 1224)  GTT Third trimester:  RPR: Non Reactive (06/24 1224)   Flu vaccine 04/25/21 HBsAg: Negative (06/24 1224)   TDaP vaccine  plan nv                                              Rhogam: n/a HIV: Non Reactive (06/24 1224)  Baby Food             Breast                                  GBS: (For PCN allergy, check sensitivities)  Contraception             pop YTK:ZSWF  Circumcision    Pediatrician    Support Person     Pt has history of Elective abortion. Please do NOT mention this to her husband or family at any time.              Review of Systems: 10 point review of systems negative unless otherwise noted in HPI  Past Medical History: Patient Active Problem List   Diagnosis Date Noted   Diet controlled gestational diabetes mellitus (GDM) in third trimester 06/20/2021    Current Diabetic Medications:  None  [x ] Aspirin 81 mg daily after 12 weeks; discontinue after 36 weeks (? A2/B GDM)  Required Referrals for A1GDM or A2GDM: '[ ]'  Diabetes Education and Testing Supplies '[ ]'  Nutrition Cousult  For A2/B GDM or higher classes of DM '[ ]'  Diabetes Education and Testing Supplies '[ ]'  Nutrition  Counsult '[ ]'  Fetal ECHO after 22-24 weeks  '[ ]'  Eye exam for retina evaluation  '[ ]'  Baseline EKG '[ ]'  US fetal growth every 4 weeks starting at 28 weeks '[ ]'  Twice weekly NST starting at [redacted] weeks gestation '[ ]'  Delivery planning contingent on fetal growth, AFI, glycemic control, and other co-morbidities but at least by 39 weeks  Baseline and surveillance labs (pulled in from EPIC, refresh links as needed)  No results found for: CREATININE, AST, ALT, TSH, PROTCRRATIO, LABPROT, PROTEIN24HR No results found for: HGBA1C  Antenatal Testing Class of DM U/S NST/AFI DELIVERY  Diabetes   A1 - good control - O24.410    A2 - good control - O24.419      A2  - poor control or poor compliance - O24.419, E11.65   (Macrosomia or polyhydramnios) **E11.65 is extra code for poor control**    A2/B - O24.919  and B-C O24.319  Poor control B-C or D-R-F-T - O24.319  or  Type I DM - O24.019  20-38  20-38  20-24-28-32-36   20-24-28-32-35-38//fetal echo  20-24-27-30-33-36-38//fetal echo  40  32//2 x wk  32//2 x wk   32//2 x wk  28//BPP wkly then 32//2 x wk  40  39  PRN   39  PRN         Multigravida of advanced maternal age in second trimester 04/25/2021   Supervision of other normal pregnancy, antepartum 01/07/2021     [facility]inic [facility]  Dating L=11 Blood type: A/Positive/-- (06/24 1224)   Genetic Screen NIPS: diploid XX Antibody:Negative (06/24 1224)  Anatomic [place] complete Rubella: 4.59 (06/24 1224)  GTT Third trimester:  RPR: Non Reactive (06/24 1224)   Flu vaccine 04/25/21 HBsAg: Negative (06/24 1224)   TDaP vaccine  plan nv                                              Rhogam: n/a HIV: Non Reactive (06/24 1224)   Baby Food             Breast  GBS: (For PCN allergy, check sensitivities)  Contraception             pop SHF:WYOV  Circumcision    Pediatrician    Support Person     Pt has history of Elective abortion. Please do  NOT mention this to her husband or family at any time.        Past Surgical History: Past Surgical History:  Procedure Laterality Date   COLPOSCOPY      Past Obstetric History: # 1 - Date: None, Sex: None, Weight: None, GA: None, Delivery: None, Apgar1: None, Apgar5: None, Living: None, Birth Comments: None  # 2 - Date: None, Sex: None, Weight: None, GA: None, Delivery: None, Apgar1: None, Apgar5: None, Living: None, Birth Comments: None   Past Gynecologic History:  Family History: Family History  Problem Relation Age of Onset   Heart Problems Mother    Hypertension Father    Breast cancer Maternal Grandmother 73   Hypertension Maternal Grandmother    Hypertension Maternal Grandfather    Breast cancer Paternal Grandmother 53   Hypertension Paternal Grandmother    Diabetes Paternal Grandfather    Lung cancer Paternal Grandfather 74   Hypertension Paternal Grandfather     Social History: Social History   Socioeconomic History   Marital status: Married    Spouse name: Not on file   Number of children: Not on file   Years of education: Not on file   Highest education level: Not on file  Occupational History   Not on file  Tobacco Use   Smoking status: Never   Smokeless tobacco: Never  Vaping Use   Vaping Use: Never used  Substance and Sexual Activity   Alcohol use: Not Currently    Alcohol/week: 2.0 standard drinks    Types: 1 Glasses of wine, 1 Cans of beer per week    Comment: occ   Drug use: No   Sexual activity: Yes    Partners: Male    Birth control/protection: None  Other Topics Concern   Not on file  Social History Narrative   ** Merged History Encounter **       Social Determinants of Health   Financial Resource Strain: Not on file  Food Insecurity: Not on file  Transportation Needs: Not on file  Physical Activity: Not on file  Stress: Not on file  Social Connections: Not on file  Intimate Partner Violence: Not on file     Medications: Prior to Admission medications   Medication Sig Start Date End Date Taking? Authorizing Provider  Accu-Chek Softclix Lancets lancets 100 each by Other route 4 (four) times daily. Use as instructed 06/09/21   Gae Dry, MD  aspirin EC 81 MG tablet Take 81 mg by mouth daily. Swallow whole.    [provider]  Blood Glucose Monitoring Suppl (ACCU-CHEK NANO SMARTVIEW) w/Device KIT 1 kit by Subdermal route as directed. Check blood sugars for fasting, and two hours after breakfast, lunch and dinner (4 checks daily) 06/09/21   Gae Dry, MD  EPINEPHrine 0.3 mg/0.3 mL IJ SOAJ injection  11/06/20   [provider]  [place] 250 MCG/BLIST AEPB Inhale into the lungs. 12/04/20   [provider]  glucose blood (ACCU-CHEK SMARTVIEW) test strip Use as instructed to check blood sugars 4 times daily 06/09/21   Gae Dry, MD  insulin detemir (LEVEMIR FLEXTOUCH) 100 UNIT/ML FlexPen Inject 10 Units into the skin at bedtime. 07/07/21   Malachy Mood, MD  Insulin Pen Needle 32G X 6 MM MISC 1 Units by Does not apply route at bedtime. 07/07/21   Malachy Mood, MD  Prenatal Vit-Fe Fumarate-FA (PRENATAL MULTIVITAMIN) TABS tablet Take 1 tablet by mouth daily at 12 noon.    [provider]  Arvid Right 150 MG/ML prefilled syringe Inject into the skin. 01/01/21   [provider]  Arvid Right 75 MG/0.5ML prefilled syringe Inject into the skin. 11/27/20   [provider]    Allergies: Allergies  Allergen Reactions   Corn-Containing Products Rash    Physical Exam: Vitals: Blood pressure (!) 150/80, weight 201 lb (91.2 kg), last menstrual period 11/13/2020.   FHT: 150, moderate, +accels, no decels Toco: N/A  General: NAD HEENT: normocephalic, anicteric Pulmonary: No increased work of breathing Cardiovascular: RRR, distal pulses 2+ Abdomen: Gravid, non-tender Leopolds: Vtx Genitourinary: deferred Extremities: no edema,  erythema, or tenderness Neurologic: Grossly intact Psychiatric: mood appropriate, affect full  Labs: No results found for this or any previous visit (from the past 24 hour(s)).  Assessment: 35 y.o. G2P0010 46w5dby 08/20/2021, by Last Menstrual Period presenting for IOL scheduling and NST  Plan: 1) Elevated BP - no headaches, vision changes, RUQ or epigastric pain - given option of labs in office or L&D, will obtain labs in office today after discussion with patient - if normal and remains asymptomatic mild range BP continue with origianl plan IOL 12/23 at midnight  2) GDM - no log today but no significant impact since starting insulin.  Agree with delivery at 37 weeks, orders placed today  3) Fetus - cat I tracing  4) PNL - Blood type A/Positive/-- (06/24 1224) / Anti-bodyscreen Negative (06/24 1224) / Rubella 4.59 (06/24 1224)  RPR Non Reactive (10/14 1051) / HBsAg Negative (06/24 1224) / HIV Non Reactive (10/14 1051) / 1-hr OGTT elevated / GBS Positive/-- (12/14 0230)  4) Immunization History -  Immunization History  Administered Date(s) Administered   Influenza,inj,Quad PF,6+ Mos 04/25/2021   Tdap 07/07/2021    5) Disposition - labs today has repeat NST 12/21  AMalachy Mood MD, [place] [facility] 07/28/2021, 3:13 PM

## 2021-07-28 NOTE — Progress Notes (Signed)
ROB - NST, no concerns. RM 4

## 2021-07-29 ENCOUNTER — Encounter: Payer: Self-pay | Admitting: Obstetrics and Gynecology

## 2021-07-29 LAB — PROTEIN / CREATININE RATIO, URINE
Creatinine, Urine: 71.7 mg/dL
Protein, Ur: 7.4 mg/dL
Protein/Creat Ratio: 103 mg/g creat (ref 0–200)

## 2021-07-29 LAB — COMPREHENSIVE METABOLIC PANEL
ALT: 14 IU/L (ref 0–32)
AST: 20 IU/L (ref 0–40)
Albumin/Globulin Ratio: 1.3 (ref 1.2–2.2)
Albumin: 3.6 g/dL — ABNORMAL LOW (ref 3.8–4.8)
Alkaline Phosphatase: 124 IU/L — ABNORMAL HIGH (ref 44–121)
BUN/Creatinine Ratio: 16 (ref 9–23)
BUN: 12 mg/dL (ref 6–20)
Bilirubin Total: <0.2 mg/dL (ref 0.0–1.2)
CO2: 17 mmol/L — ABNORMAL LOW (ref 20–29)
Calcium: 9.6 mg/dL (ref 8.7–10.2)
Chloride: 103 mmol/L (ref 96–106)
Creatinine, Ser: 0.77 mg/dL (ref 0.57–1.00)
Globulin, Total: 2.7 g/dL (ref 1.5–4.5)
Glucose: 92 mg/dL (ref 70–99)
Potassium: 3.8 mmol/L (ref 3.5–5.2)
Sodium: 138 mmol/L (ref 134–144)
Total Protein: 6.3 g/dL (ref 6.0–8.5)
eGFR: 103 mL/min/{1.73_m2} (ref >59)

## 2021-07-29 LAB — CBC
Hematocrit: 37.6 % (ref 34.0–46.6)
Hemoglobin: 13.1 g/dL (ref 11.1–15.9)
MCH: 31.6 pg (ref 26.6–33.0)
MCHC: 34.8 g/dL (ref 31.5–35.7)
MCV: 91 fL (ref 79–97)
Platelets: 342 10*3/uL (ref 150–450)
RBC: 4.15 x10E6/uL (ref 3.77–5.28)
RDW: 13 % (ref 11.7–15.4)
WBC: 12.8 10*3/uL — ABNORMAL HIGH (ref 3.4–10.8)

## 2021-07-30 ENCOUNTER — Other Ambulatory Visit: Payer: Self-pay

## 2021-07-30 ENCOUNTER — Ambulatory Visit (INDEPENDENT_AMBULATORY_CARE_PROVIDER_SITE_OTHER): Payer: BC Managed Care – PPO | Admitting: Obstetrics & Gynecology

## 2021-07-30 VITALS — BP 126/80 | Wt 199.0 lb

## 2021-07-30 DIAGNOSIS — Z3A37 37 weeks gestation of pregnancy: Secondary | ICD-10-CM

## 2021-07-30 DIAGNOSIS — O24410 Gestational diabetes mellitus in pregnancy, diet controlled: Secondary | ICD-10-CM

## 2021-07-30 DIAGNOSIS — Z3483 Encounter for supervision of other normal pregnancy, third trimester: Secondary | ICD-10-CM

## 2021-07-30 DIAGNOSIS — O09523 Supervision of elderly multigravida, third trimester: Secondary | ICD-10-CM

## 2021-07-30 DIAGNOSIS — O163 Unspecified maternal hypertension, third trimester: Secondary | ICD-10-CM

## 2021-07-30 NOTE — Progress Notes (Signed)
Subjective  Fetal Movement? yes Contractions? no Leaking Fluid? no Vaginal Bleeding? no BS mostly WNL No s/sx preeclampsia Objective  BP 126/80    Wt 199 lb (90.3 kg)    LMP 11/13/2020 (Exact Date)    BMI 33.12 kg/m  General: NAD Pumonary: no increased work of breathing Abdomen: gravid, non-tender Extremities: no edema Psychiatric: mood appropriate, affect full  Labs reviewed, normal for preeclampsia   A NST procedure was performed with FHR monitoring and a normal baseline established, appropriate time of 20-40 minutes of evaluation, and accels >2 seen w 15x15 characteristics.  Results show a REACTIVE NST.   Assessment  35 y.o. G2P0010 at [redacted]w[redacted]d by  08/20/2021, by Last Menstrual Period presenting for routine prenatal visit  Plan   Problem List Items Addressed This Visit       Endocrine   Diet controlled gestational diabetes mellitus (GDM) in third trimester     Other   Supervision of other normal pregnancy, antepartum - Primary   Multigravida of advanced maternal age in second trimester   Other Visit Diagnoses     Elevated blood pressure complicating pregnancy, antepartum, third trimester       [redacted] weeks gestation of pregnancy         Cont to monitor for s/sx preeclampsia IOL 37 weeks (Friday) H&P done, orders in place RIsks of diabetes and HTN d/w pt  pregnancy 1 Problems (from 01/07/21 to present)     Problem Noted Resolved   Diet controlled gestational diabetes mellitus (GDM) in third trimester 06/20/2021 by Nadara Mustard, MD No   Overview Signed 06/20/2021 11:11 AM by Nadara Mustard, MD    Current Diabetic Medications:  None  [x ] Aspirin 81 mg daily after 12 weeks; discontinue after 36 weeks (? A2/B GDM)  Required Referrals for A1GDM or A2GDM: [ ]  Diabetes Education and Testing Supplies [ ]  Nutrition Cousult  For A2/B GDM or higher classes of DM [ ]  Diabetes Education and Testing Supplies [ ]  Nutrition Counsult [ ]  Fetal ECHO after 22-24 weeks  [  ] Eye exam for retina evaluation  [ ]  Baseline EKG [ ]  fetal growth every 4 weeks starting at 28 weeks [ ]  Twice weekly NST starting at [redacted] weeks gestation [ ]  Delivery planning contingent on fetal growth, AFI, glycemic control, and other co-morbidities but at least by 39 weeks  Baseline and surveillance labs (pulled in from EPIC, refresh links as needed)  No results found for: CREATININE, AST, ALT, TSH, PROTCRRATIO, LABPROT, PROTEIN24HR No results found for: HGBA1C  Antenatal Testing Class of DM U/S NST/AFI DELIVERY  Diabetes   A1 - good control - O24.410    A2 - good control - O24.419      A2  - poor control or poor compliance - O24.419, E11.65   (Macrosomia or polyhydramnios) **E11.65 is extra code for poor control**    A2/B - O24.919  and B-C O24.319  Poor control B-C or D-R-F-T - O24.319  or  Type I DM - O24.019  20-38  20-38  20-24-28-32-36   20-24-28-32-35-38//fetal echo  20-24-27-30-33-36-38//fetal echo  40  32//2 x wk  32//2 x wk   32//2 x wk  28//BPP wkly then 32//2 x wk  40  39  PRN   39  PRN           Supervision of other normal pregnancy, antepartum 01/07/2021 by [place], CNM No   Overview Addendum 06/20/2021 11:23 AM by ,  MD     [facility]inic [facility] P[facility]atal [facility]  Dating L=11 Blood type: A/Positive/-- (06/24 1224)   Genetic Screen NIPS: diploid XX Antibody:Negative (06/24 1224)  Anatomic [place] complete Rubella: 4.59 (06/24 1224)  GTT Third trimester:  RPR: Non Reactive (06/24 1224)   Flu vaccine 04/25/21 HBsAg: Negative (06/24 1224)   TDaP vaccine  plan nv                                              Rhogam: n/a HIV: Non Reactive (06/24 1224)   Baby Food             Breast                                  GBS: (For PCN allergy, check sensitivities)  Contraception             pop XEN:MMHW  Circumcision    Pediatrician    Support Person     Pt has history of Elective abortion. Please do NOT mention this to her  husband or family at any time.        Annamarie Major, MD, [facility], [facility] 07/30/2021  2:30 PM

## 2021-07-31 ENCOUNTER — Inpatient Hospital Stay
Admission: EM | Admit: 2021-07-31 | Discharge: 2021-08-04 | DRG: 788 | Disposition: A | Discharge status: Home / Self Care | Payer: BC Managed Care – PPO | Attending: Obstetrics & Gynecology | Admitting: Obstetrics & Gynecology

## 2021-07-31 DIAGNOSIS — Z3480 Encounter for supervision of other normal pregnancy, unspecified trimester: Secondary | ICD-10-CM

## 2021-07-31 DIAGNOSIS — Z3A37 37 weeks gestation of pregnancy: Secondary | ICD-10-CM

## 2021-07-31 DIAGNOSIS — O24424 Gestational diabetes mellitus in childbirth, insulin controlled: Principal | ICD-10-CM | POA: Diagnosis present

## 2021-07-31 DIAGNOSIS — O24410 Gestational diabetes mellitus in pregnancy, diet controlled: Secondary | ICD-10-CM

## 2021-07-31 DIAGNOSIS — Z98891 History of uterine scar from previous surgery: Secondary | ICD-10-CM

## 2021-07-31 DIAGNOSIS — O24414 Gestational diabetes mellitus in pregnancy, insulin controlled: Secondary | ICD-10-CM | POA: Diagnosis present

## 2021-07-31 DIAGNOSIS — O99824 Streptococcus B carrier state complicating childbirth: Secondary | ICD-10-CM | POA: Diagnosis present

## 2021-07-31 DIAGNOSIS — O339 Maternal care for disproportion, unspecified: Secondary | ICD-10-CM | POA: Diagnosis present

## 2021-07-31 DIAGNOSIS — O09522 Supervision of elderly multigravida, second trimester: Secondary | ICD-10-CM | POA: Diagnosis present

## 2021-08-01 ENCOUNTER — Inpatient Hospital Stay: Payer: BC Managed Care – PPO | Admitting: Anesthesiology

## 2021-08-01 ENCOUNTER — Other Ambulatory Visit: Payer: Self-pay

## 2021-08-01 ENCOUNTER — Encounter: Payer: Self-pay | Admitting: Anesthesiology

## 2021-08-01 ENCOUNTER — Encounter: Payer: Self-pay | Admitting: Obstetrics and Gynecology

## 2021-08-01 DIAGNOSIS — O339 Maternal care for disproportion, unspecified: Secondary | ICD-10-CM | POA: Diagnosis present

## 2021-08-01 DIAGNOSIS — Z3A37 37 weeks gestation of pregnancy: Secondary | ICD-10-CM | POA: Diagnosis not present

## 2021-08-01 DIAGNOSIS — O98813 Other maternal infectious and parasitic diseases complicating pregnancy, third trimester: Secondary | ICD-10-CM | POA: Diagnosis not present

## 2021-08-01 DIAGNOSIS — O99824 Streptococcus B carrier state complicating childbirth: Secondary | ICD-10-CM | POA: Diagnosis present

## 2021-08-01 DIAGNOSIS — O24414 Gestational diabetes mellitus in pregnancy, insulin controlled: Secondary | ICD-10-CM | POA: Diagnosis not present

## 2021-08-01 DIAGNOSIS — O09523 Supervision of elderly multigravida, third trimester: Secondary | ICD-10-CM | POA: Diagnosis not present

## 2021-08-01 DIAGNOSIS — O24424 Gestational diabetes mellitus in childbirth, insulin controlled: Secondary | ICD-10-CM | POA: Diagnosis present

## 2021-08-01 LAB — COMPREHENSIVE METABOLIC PANEL
ALT: 16 U/L (ref 0–44)
AST: 25 U/L (ref 15–41)
Albumin: 3.1 g/dL — ABNORMAL LOW (ref 3.5–5.0)
Alkaline Phosphatase: 110 U/L (ref 38–126)
Anion gap: 6 (ref 5–15)
BUN: 16 mg/dL (ref 6–20)
CO2: 22 mmol/L (ref 22–32)
Calcium: 9.5 mg/dL (ref 8.9–10.3)
Chloride: 109 mmol/L (ref 98–111)
Creatinine, Ser: 0.74 mg/dL (ref 0.44–1.00)
GFR, Estimated: >60 mL/min (ref >60)
Glucose, Bld: 111 mg/dL — ABNORMAL HIGH (ref 70–99)
Potassium: 3.9 mmol/L (ref 3.5–5.1)
Sodium: 137 mmol/L (ref 135–145)
Total Bilirubin: 0.4 mg/dL (ref 0.3–1.2)
Total Protein: 7 g/dL (ref 6.5–8.1)

## 2021-08-01 LAB — ABO/RH: ABO/RH(D): A POS

## 2021-08-01 LAB — CBC
HCT: 36.6 % (ref 36.0–46.0)
Hemoglobin: 12.7 g/dL (ref 12.0–15.0)
MCH: 31.3 pg (ref 26.0–34.0)
MCHC: 34.7 g/dL (ref 30.0–36.0)
MCV: 90.1 fL (ref 80.0–100.0)
Platelets: 308 10*3/uL (ref 150–400)
RBC: 4.06 MIL/uL (ref 3.87–5.11)
RDW: 13.3 % (ref 11.5–15.5)
WBC: 11.8 10*3/uL — ABNORMAL HIGH (ref 4.0–10.5)
nRBC: 0 % (ref 0.0–0.2)

## 2021-08-01 LAB — PROTEIN / CREATININE RATIO, URINE
Creatinine, Urine: 105 mg/dL
Protein Creatinine Ratio: 0.1 mg/mg{Cre} (ref 0.00–0.15)
Total Protein, Urine: 11 mg/dL

## 2021-08-01 LAB — GLUCOSE, CAPILLARY
Glucose-Capillary: 88 mg/dL (ref 70–99)
Glucose-Capillary: 87 mg/dL (ref 70–99)
Glucose-Capillary: 75 mg/dL (ref 70–99)
Glucose-Capillary: 79 mg/dL (ref 70–99)

## 2021-08-01 LAB — TYPE AND SCREEN
ABO/RH(D): A POS
Antibody Screen: NEGATIVE

## 2021-08-01 LAB — RPR: RPR Ser Ql: NONREACTIVE

## 2021-08-01 MED ORDER — LIDOCAINE HCL (PF) 1 % IJ SOLN
INTRAMUSCULAR | Status: AC
  Filled 2021-08-01: qty 30

## 2021-08-01 MED ORDER — TERBUTALINE SULFATE 1 MG/ML IJ SOLN: 0.2500 mg | Freq: Once | INTRAMUSCULAR | Status: DC | PRN

## 2021-08-01 MED ORDER — LIDOCAINE-EPINEPHRINE (PF) 1.5 %-1:200000 IJ SOLN
INTRAMUSCULAR | Status: DC | PRN
  Administered 2021-08-01: 3 mL via EPIDURAL

## 2021-08-01 MED ORDER — FENTANYL-BUPIVACAINE-NACL 0.5-0.125-0.9 MG/250ML-% EP SOLN
12.0000 mL/h | EPIDURAL | Status: DC | PRN
  Administered 2021-08-01 – 2021-08-02 (×2): 12 mL/h via EPIDURAL

## 2021-08-01 MED ORDER — DIPHENHYDRAMINE HCL 50 MG/ML IJ SOLN
50.0000 mg | Freq: Once | INTRAMUSCULAR | Status: AC
  Administered 2021-08-01: 18:00:00 50 mg via INTRAVENOUS

## 2021-08-01 MED ORDER — ONDANSETRON HCL 4 MG/2ML IJ SOLN
4.0000 mg | Freq: Four times a day (QID) | INTRAMUSCULAR | Status: DC | PRN
  Administered 2021-08-01 (×2): 4 mg via INTRAVENOUS
  Filled 2021-08-01 (×2): qty 2

## 2021-08-01 MED ORDER — OXYTOCIN 10 UNIT/ML IJ SOLN
INTRAMUSCULAR | Status: AC
  Filled 2021-08-01: qty 2

## 2021-08-01 MED ORDER — OXYTOCIN-SODIUM CHLORIDE 30-0.9 UT/500ML-% IV SOLN
2.5000 [IU]/h | INTRAVENOUS | Status: DC
  Administered 2021-08-02: 03:00:00 30 [IU] via INTRAVENOUS
  Filled 2021-08-01 (×2): qty 500

## 2021-08-01 MED ORDER — PHENYLEPHRINE 40 MCG/ML (10ML) SYRINGE FOR IV PUSH (FOR BLOOD PRESSURE SUPPORT): 80.0000 ug | PREFILLED_SYRINGE | INTRAVENOUS | Status: DC | PRN

## 2021-08-01 MED ORDER — SOD CITRATE-CITRIC ACID 500-334 MG/5ML PO SOLN: 30.0000 mL | ORAL | Status: DC | PRN

## 2021-08-01 MED ORDER — FENTANYL-BUPIVACAINE-NACL 0.5-0.125-0.9 MG/250ML-% EP SOLN
EPIDURAL | Status: AC
  Filled 2021-08-01: qty 250

## 2021-08-01 MED ORDER — AMMONIA AROMATIC IN INHA
RESPIRATORY_TRACT | Status: AC
  Filled 2021-08-01: qty 10

## 2021-08-01 MED ORDER — MISOPROSTOL 25 MCG QUARTER TABLET
ORAL_TABLET | ORAL | Status: AC
  Filled 2021-08-01: qty 1

## 2021-08-01 MED ORDER — INSULIN ASPART 100 UNIT/ML IJ SOLN: 0.0000 [IU] | INTRAMUSCULAR | Status: DC

## 2021-08-01 MED ORDER — LACTATED RINGERS IV SOLN
500.0000 mL | Freq: Once | INTRAVENOUS | Status: AC
  Administered 2021-08-01: 07:00:00 500 mL via INTRAVENOUS

## 2021-08-01 MED ORDER — LIDOCAINE HCL (PF) 1 % IJ SOLN: 30.0000 mL | INTRAMUSCULAR | Status: DC | PRN

## 2021-08-01 MED ORDER — DIPHENHYDRAMINE HCL 50 MG/ML IJ SOLN
12.5000 mg | INTRAMUSCULAR | Status: DC | PRN
  Filled 2021-08-01: qty 1

## 2021-08-01 MED ORDER — MISOPROSTOL 25 MCG QUARTER TABLET
25.0000 ug | ORAL_TABLET | ORAL | Status: DC | PRN
  Administered 2021-08-01: 01:00:00 25 ug via VAGINAL

## 2021-08-01 MED ORDER — BUTORPHANOL TARTRATE 1 MG/ML IJ SOLN: 1.0000 mg | INTRAMUSCULAR | Status: DC | PRN

## 2021-08-01 MED ORDER — ACETAMINOPHEN 325 MG PO TABS
650.0000 mg | ORAL_TABLET | ORAL | Status: DC | PRN
  Administered 2021-08-01: 20:00:00 650 mg via ORAL
  Filled 2021-08-01: qty 2

## 2021-08-01 MED ORDER — LACTATED RINGERS IV SOLN: INTRAVENOUS | Status: DC

## 2021-08-01 MED ORDER — OXYTOCIN BOLUS FROM INFUSION: 333.0000 mL | Freq: Once | INTRAVENOUS | Status: DC

## 2021-08-01 MED ORDER — OXYTOCIN-SODIUM CHLORIDE 30-0.9 UT/500ML-% IV SOLN
1.0000 m[IU]/min | INTRAVENOUS | Status: DC
  Administered 2021-08-01 (×2): 2 m[IU]/min via INTRAVENOUS
  Administered 2021-08-01: 17:00:00 20 m[IU]/min via INTRAVENOUS

## 2021-08-01 MED ORDER — LIDOCAINE HCL (PF) 1 % IJ SOLN
INTRAMUSCULAR | Status: DC | PRN
  Administered 2021-08-01: 3 mL

## 2021-08-01 MED ORDER — PENICILLIN G POT IN DEXTROSE 60000 UNIT/ML IV SOLN
3.0000 10*6.[IU] | INTRAVENOUS | Status: DC
  Administered 2021-08-01 (×3): 3 10*6.[IU] via INTRAVENOUS
  Filled 2021-08-01 (×3): qty 50

## 2021-08-01 MED ORDER — MISOPROSTOL 25 MCG QUARTER TABLET
25.0000 ug | ORAL_TABLET | ORAL | Status: DC | PRN
  Filled 2021-08-01: qty 1

## 2021-08-01 MED ORDER — EPHEDRINE 5 MG/ML INJ: 10.0000 mg | INTRAVENOUS | Status: DC | PRN

## 2021-08-01 MED ORDER — MISOPROSTOL 200 MCG PO TABS
ORAL_TABLET | ORAL | Status: AC
  Filled 2021-08-01: qty 4

## 2021-08-01 MED ORDER — LACTATED RINGERS IV SOLN: 500.0000 mL | INTRAVENOUS | Status: DC | PRN

## 2021-08-01 MED ORDER — BUPIVACAINE HCL (PF) 0.25 % IJ SOLN
INTRAMUSCULAR | Status: DC | PRN
  Administered 2021-08-01 (×2): 4 mL via EPIDURAL

## 2021-08-01 MED ORDER — SODIUM CHLORIDE 0.9 % IV SOLN
5.0000 10*6.[IU] | Freq: Once | INTRAVENOUS | Status: AC
  Administered 2021-08-01: 10:00:00 5 10*6.[IU] via INTRAVENOUS
  Filled 2021-08-01: qty 5

## 2021-08-01 NOTE — Progress Notes (Signed)
°  Labor Progress Note   35 y.o. G2P0010 @ [redacted]w[redacted]d , admitted for  Pregnancy, Labor Management. IOL GDM  Subjective:  Comfortable with epidural  Objective:  BP (!) 145/71    Pulse 71    Temp 98.3 F (36.8 C) (Oral)    Resp 16    Ht 5\' 5"  (1.651 m)    Wt 89.8 kg    LMP 11/13/2020 (Exact Date)    SpO2 100%    BMI 32.95 kg/m  Abd: gravid, ND, FHT present, mild tenderness on exam Extr: no edema SVE: CERVIX: 9 cm dilated, 100 effaced, -1, 0 station Bloody show  EFM: FHR: 140 bpm, variability: moderate,  accelerations:  Present,  decelerations:  Absent Toco: Frequency: Every 2-3 minutes Labs: I have reviewed the patient's lab results.   Assessment & Plan:  G2P0010 @ [redacted]w[redacted]d, admitted for  Pregnancy and Labor/Delivery Management  1. Pain management: epidural. 2. FWB: FHT category I.  3. ID: GBS positive: penicillin prophylaxis 4. Labor management: continue pitocin titration  All discussed with patient, see orders   [redacted]w[redacted]d, CNM [facility] 08/01/2021  3:12 PM

## 2021-08-01 NOTE — Progress Notes (Signed)
°  Labor Progress Note   35 y.o. G2P0010 @ [redacted]w[redacted]d , admitted for  Pregnancy, Labor Management. IOL GDM  Subjective:  Comfortable with epidural  Objective:  BP (!) 143/79    Pulse 86    Temp 97.9 F (36.6 C) (Oral)    Resp 16    Ht 5\' 5"  (1.651 m)    Wt 89.8 kg    LMP 11/13/2020 (Exact Date)    SpO2 100%    BMI 32.95 kg/m  Abd: gravid, ND, FHT present, mild tenderness on exam Extr: no edema SVE: CERVIX: 9.5 cm dilated, slight swelling effaced, 0 station  EFM: FHR: 140 bpm, variability: moderate,  accelerations:  Present,  decelerations:  Absent Toco: Frequency: Every 2-3 minutes Labs: I have reviewed the patient's lab results.   Assessment & Plan:  G2P0010 @ [redacted]w[redacted]d, admitted for  Pregnancy and Labor/Delivery Management  1. Pain management: epidural. 2. FWB: FHT category I.  3. ID: GBS positive: adequately treated with penicillin 4. Labor management: continue pitocin titration/labor down  All discussed with patient, see orders   [redacted]w[redacted]d, CNM [facility] 08/01/2021  7:24 PM

## 2021-08-01 NOTE — Progress Notes (Signed)
Subjective:  Feeling some contractions, not painful  Objective:   Vitals: Blood pressure (!) 112/52, pulse 69, temperature 98.2 F (36.8 C), temperature source Oral, resp. rate 16, height 5\' 5"  (1.651 m), weight 89.8 kg, last menstrual period 11/13/2020. General: NAD Abdomen: gravid, non-tender Cervical Exam:  Dilation: 3 Effacement (%): 50 Cervical Position: Posterior Station: -3 Presentation: Vertex Exam by:: Dr. [email or web address] MD  FHT: 150, moderate, +accels, no decels Toco: q5-30min  Results for orders placed or performed during the hospital encounter of 07/31/21 (from the past 24 hour(s))  CBC     Status: Abnormal   Collection Time: 08/01/21 12:57 AM  Result Value Ref Range   WBC 11.8 (H) 4.0 - 10.5 K/uL   RBC 4.06 3.87 - 5.11 MIL/uL   Hemoglobin 12.7 12.0 - 15.0 g/dL   HCT 08/03/21 95.6 - 21.3 %   MCV 90.1 80.0 - 100.0 fL   MCH 31.3 26.0 - 34.0 pg   MCHC 34.7 30.0 - 36.0 g/dL   RDW 08.6 57.8 - 46.9 %   Platelets 308 150 - 400 K/uL   nRBC 0.0 0.0 - 0.2 %  Type and screen     Status: None (Preliminary result)   Collection Time: 08/01/21 12:57 AM  Result Value Ref Range   ABO/RH(D) PENDING    Antibody Screen PENDING    Sample Expiration      08/04/2021,2359 Performed at [facility], [address] [place], [place]   Protein / creatinine ratio, urine     Status: None   Collection Time: 08/01/21 12:57 AM  Result Value Ref Range   Creatinine, Urine 105 mg/dL   Total Protein, Urine 11 mg/dL   Protein Creatinine Ratio 0.10 0.00 - 0.15 mg/mg[Cre]  Comprehensive metabolic panel     Status: Abnormal   Collection Time: 08/01/21 12:57 AM  Result Value Ref Range   Sodium 137 135 - 145 mmol/L   Potassium 3.9 3.5 - 5.1 mmol/L   Chloride 109 98 - 111 mmol/L   CO2 22 22 - 32 mmol/L   Glucose, Bld 111 (H) 70 - 99 mg/dL   BUN 16 6 - 20 mg/dL   Creatinine, Ser 08/03/21 0.44 - 1.00 mg/dL   Calcium 9.5 8.9 - 3.24 mg/dL   Total Protein 7.0 6.5 - 8.1 g/dL    Albumin 3.1 (L) 3.5 - 5.0 g/dL   AST 25 15 - 41 U/L   ALT 16 0 - 44 U/L   Alkaline Phosphatase 110 38 - 126 U/L   Total Bilirubin 0.4 0.3 - 1.2 mg/dL   GFR, Estimated 40.1 >02 mL/min   Anion gap 6 5 - 15  Type and screen     Status: None   Collection Time: 08/01/21  1:52 AM  Result Value Ref Range   ABO/RH(D) A POS    Antibody Screen NEG    Sample Expiration      08/04/2021,2359 Performed at [facility], [address] [place], [place]   ABO/Rh     Status: None   Collection Time: 08/01/21  1:56 AM  Result Value Ref Range   ABO/RH(D)      A POS Performed at [facility], [address], [place], [place]     Assessment:   35 y.o. G2P0010 [redacted]w[redacted]d IOL GDM  Plan:   1) Labor  - AROM clear - switch to pitocin  2) Fetus - cat I tracing  3) GDM - q4hrs accuchecks  [redacted]w[redacted]d  Bonney Aid, MD, [facility], [facility] 08/01/2021, 5:47 AM

## 2021-08-01 NOTE — Progress Notes (Signed)
Pt seen, plan of care discussed IOL for term/ GDMA2 BS have been normal Pt has been escalating in pain and cervical dilation AROM recently Epidural now Pitocin to restart after epidural FWR  Annamarie Major, MD, [facility], [facility] 08/01/2021  7:24 AM

## 2021-08-01 NOTE — Anesthesia Preprocedure Evaluation (Signed)
Anesthesia Evaluation  Patient identified by MRN, date of birth, ID band Patient awake    Reviewed: Allergy & Precautions, H&P , NPO status , Patient's Chart, lab work & pertinent test results, reviewed documented beta blocker date and time   Airway Mallampati: II  TM Distance: >3 FB Neck ROM: full    Dental no notable dental hx. (+) Teeth Intact   Pulmonary asthma ,    Pulmonary exam normal breath sounds clear to auscultation       Cardiovascular Exercise Tolerance: Good negative cardio ROS   Rhythm:regular Rate:Normal     Neuro/Psych negative neurological ROS  negative psych ROS   GI/Hepatic negative GI ROS, Neg liver ROS,   Endo/Other  negative endocrine ROSdiabetes, Well Controlled, Type 1, Insulin Dependent  Renal/GU      Musculoskeletal   Abdominal   Peds  Hematology negative hematology ROS (+)   Anesthesia Other Findings   Reproductive/Obstetrics (+) Pregnancy                             Anesthesia Physical Anesthesia Plan  ASA: 2  Anesthesia Plan: Epidural   Post-op Pain Management:    Induction:   PONV Risk Score and Plan:   Airway Management Planned:   Additional Equipment:   Intra-op Plan:   Post-operative Plan:   Informed Consent: I have reviewed the patients History and Physical, chart, labs and discussed the procedure including the risks, benefits and alternatives for the proposed anesthesia with the patient or authorized representative who has indicated his/her understanding and acceptance.       Plan Discussed with:   Anesthesia Plan Comments:         Anesthesia Quick Evaluation

## 2021-08-01 NOTE — Anesthesia Procedure Notes (Signed)
Epidural Patient location during procedure: OB Start time: 08/01/2021 7:41 AM End time: 08/01/2021 8:00 AM  Staffing Anesthesiologist: Yevette Edwards, MD Resident/CRNA: Jeanine Luz, CRNA Performed: resident/CRNA   Preanesthetic Checklist Completed: patient identified, IV checked, site marked, risks and benefits discussed, surgical consent, monitors and equipment checked, pre-op evaluation and timeout performed  Epidural Patient position: sitting Prep: ChloraPrep Patient monitoring: heart rate, continuous pulse ox and blood pressure Approach: midline Location: L3-L4 Injection technique: LOR saline  Needle:  Needle type: Tuohy  Needle gauge: 17 G Needle length: 9 cm and 9 Needle insertion depth: 6 cm Catheter type: closed end flexible Catheter size: 19 Gauge Catheter at skin depth: 10 cm Test dose: negative and 1.5% lidocaine with Epi 1:200 K  Assessment Events: blood not aspirated, injection not painful, no injection resistance, no paresthesia and negative IV test  Additional Notes 1 attempt Pt. Evaluated and documentation done after procedure finished. Patient identified. Risks/Benefits/Options discussed with patient including but not limited to bleeding, infection, nerve damage, paralysis, failed block, incomplete pain control, headache, blood pressure changes, nausea, vomiting, reactions to medication both or allergic, itching and postpartum back pain. Confirmed with bedside nurse the patient's most recent platelet count. Confirmed with patient that they are not currently taking any anticoagulation, have any bleeding history or any family history of bleeding disorders. Patient expressed understanding and wished to proceed. All questions were answered. Sterile technique was used throughout the entire procedure. Please see nursing notes for vital signs. Test dose was given through epidural catheter and negative prior to continuing to dose epidural or start infusion. Warning  signs of high block given to the patient including shortness of breath, tingling/numbness in hands, complete motor block, or any concerning symptoms with instructions to call for help. Patient was given instructions on fall risk and not to get out of bed. All questions and concerns addressed with instructions to call with any issues or inadequate analgesia.    Patient tolerated the insertion well without immediate complications.Reason for block:procedure for pain

## 2021-08-01 NOTE — H&P (Signed)
Obstetric H&P   Chief Complaint: IOL GDM  Prenatal Care Provider: [facility]  History of Present Illness: 35 y.o. G2P0010 50w2dby LMP=11 week UKoreaderived EDD of 08/20/2021, by Last Menstrual Period presenting to L&D for scheduled IOL for worsening GDM, insulin.  +FM, no LOF, no VB, no ctx.     Pregnancy otherwise notable for AMA negative NIPT  Pregravid weight 68.9 kg Total Weight Gain 20.9 kg  pregnancy 1 Problems (from 01/07/21 to present)     Problem Noted Resolved   Diet controlled gestational diabetes mellitus (GDM) in third trimester 06/20/2021 by HGae Dry MD No   Overview Signed 06/20/2021 11:11 AM by HGae Dry MD    Current Diabetic Medications:  None  [x ] Aspirin 81 mg daily after 12 weeks; discontinue after 36 weeks (? A2/B GDM)  Required Referrals for A1GDM or A2GDM: [ ] Diabetes Education and Testing Supplies [ ] Nutrition Cousult  For A2/B GDM or higher classes of DM [ ] Diabetes Education and TEquities trader[ ] Nutrition Counsult [ ] Fetal ECHO after 22-24 weeks  [ ] Eye exam for retina evaluation  [ ] Baseline EKG [ ] UKoreafetal growth every 4 weeks starting at 21weeks [ ] Twice weekly NST starting at 376weeks gestation [ ] Delivery planning contingent on fetal growth, AFI, glycemic control, and other co-morbidities but at least by 39 weeks  Baseline and surveillance labs (pulled in from EPIC, refresh links as needed)  No results found for: CREATININE, AST, ALT, TSH, PROTCRRATIO, LABPROT, PROTEIN24HR No results found for: HGBA1C  Antenatal Testing Class of DM U/S NST/AFI DELIVERY  Diabetes   A1 - good control - O24.410    A2 - good control - O24.419      A2  - poor control or poor compliance - O24.419, E11.65   (Macrosomia or polyhydramnios) **E11.65 is extra code for poor control**    A2/B - O24.919  and B-C O24.319  Poor control B-C or D-R-F-T - O24.319  or  Type I DM - O24.019   20-38  20-38  20-24-28-32-36   20-24-28-32-35-38//fetal echo  20-24-27-30-33-36-38//fetal echo  40  32//2 x wk  32//2 x wk   32//2 x wk  28//BPP wkly then 32//2 x wk  40  39  PRN   39  PRN           Supervision of other normal pregnancy, antepartum 01/07/2021 by FImagene Riches CNM No   Overview Addendum 06/20/2021 11:23 AM by HGae Dry MD     [facility] Prenatal Labs  Dating L=11 Blood type: A/Positive/-- (06/24 1224)   Genetic Screen NIPS: diploid XX Antibody:Negative (06/24 1224)  Anatomic UKoreacomplete Rubella: 4.59 (06/24 1224)  GTT Third trimester:  RPR: Non Reactive (06/24 1224)   Flu vaccine 04/25/21 HBsAg: Negative (06/24 1224)   TDaP vaccine  plan nv                                              Rhogam: n/a HIV: Non Reactive (06/24 1224)   Baby Food             Breast  GBS: Positive  Contraception             pop HDQ:QIWL  Circumcision    Pediatrician    Support Person     Pt has history of Elective abortion. Please do NOT mention this to her husband or family at any time.              Review of Systems: 10 point review of systems negative unless otherwise noted in HPI  Past Medical History: Patient Active Problem List   Diagnosis Date Noted   Insulin controlled gestational diabetes mellitus (GDM) in third trimester 08/01/2021   Diet controlled gestational diabetes mellitus (GDM) in third trimester 06/20/2021    Current Diabetic Medications:  None  [x ] Aspirin 81 mg daily after 12 weeks; discontinue after 36 weeks (? A2/B GDM)  Required Referrals for A1GDM or A2GDM: [ ] Diabetes Education and Testing Supplies [ ] Nutrition Cousult  For A2/B GDM or higher classes of DM [ ] Diabetes Education and Equities trader [ ] Nutrition Counsult [ ] Fetal ECHO after 22-24 weeks  [ ] Eye exam for retina evaluation  [ ] Baseline EKG [ ] US fetal growth every 4 weeks starting at 37 weeks [ ]  Twice weekly NST starting at [redacted] weeks gestation [ ] Delivery planning contingent on fetal growth, AFI, glycemic control, and other co-morbidities but at least by 39 weeks  Baseline and surveillance labs (pulled in from EPIC, refresh links as needed)  No results found for: CREATININE, AST, ALT, TSH, PROTCRRATIO, LABPROT, PROTEIN24HR No results found for: HGBA1C  Antenatal Testing Class of DM U/S NST/AFI DELIVERY  Diabetes   A1 - good control - O24.410    A2 - good control - O24.419      A2  - poor control or poor compliance - O24.419, E11.65   (Macrosomia or polyhydramnios) **E11.65 is extra code for poor control**    A2/B - O24.919  and B-C O24.319  Poor control B-C or D-R-F-T - O24.319  or  Type I DM - O24.019  20-38  20-38  20-24-28-32-36   20-24-28-32-35-38//fetal echo  20-24-27-30-33-36-38//fetal echo  40  32//2 x wk  32//2 x wk   32//2 x wk  28//BPP wkly then 32//2 x wk  40  39  PRN   87  PRN         Multigravida of advanced maternal age in second trimester 04/25/2021   Supervision of other normal pregnancy, antepartum 01/07/2021     [facility] Prenatal Labs  Dating L=11 Blood type: A/Positive/-- (06/24 1224)   Genetic Screen NIPS: diploid XX Antibody:Negative (06/24 1224)  Anatomic [place] complete Rubella: 4.59 (06/24 1224)  GTT Third trimester:  RPR: Non Reactive (06/24 1224)   Flu vaccine 04/25/21 HBsAg: Negative (06/24 1224)   TDaP vaccine  plan nv                                              Rhogam: n/a HIV: Non Reactive (06/24 1224)   Baby Food             Breast                                  GBS: (For PCN allergy, check sensitivities)  Contraception  pop ZOX:WRUE  Circumcision    Pediatrician    Support Person     Pt has history of Elective abortion. Please do NOT mention this to her husband or family at any time.        Past Surgical History: Past Surgical History:  Procedure Laterality Date   COLPOSCOPY       Past Obstetric History: # 1 - Date: None, Sex: None, Weight: None, GA: None, Delivery: None, Apgar1: None, Apgar5: None, Living: None, Birth Comments: None  # 2 - Date: None, Sex: None, Weight: None, GA: None, Delivery: None, Apgar1: None, Apgar5: None, Living: None, Birth Comments: None   Past Gynecologic History:  Family History: Family History  Problem Relation Age of Onset   Heart Problems Mother    Hypertension Father    Breast cancer Maternal Grandmother 83   Hypertension Maternal Grandmother    Hypertension Maternal Grandfather    Breast cancer Paternal Grandmother 21   Hypertension Paternal Grandmother    Diabetes Paternal Grandfather    Lung cancer Paternal Grandfather 74   Hypertension Paternal Grandfather     Social History: Social History   Socioeconomic History   Marital status: Married    Spouse name: Not on file   Number of children: Not on file   Years of education: Not on file   Highest education level: Not on file  Occupational History   Not on file  Tobacco Use   Smoking status: Never   Smokeless tobacco: Never  Vaping Use   Vaping Use: Never used  Substance and Sexual Activity   Alcohol use: Not Currently    Alcohol/week: 2.0 standard drinks    Types: 1 Glasses of wine, 1 Cans of beer per week    Comment: occ   Drug use: No   Sexual activity: Yes    Partners: Male    Birth control/protection: None  Other Topics Concern   Not on file  Social History Narrative   ** Merged History Encounter **       Social Determinants of Health   Financial Resource Strain: Not on file  Food Insecurity: Not on file  Transportation Needs: Not on file  Physical Activity: Not on file  Stress: Not on file  Social Connections: Not on file  Intimate Partner Violence: Not on file    Medications: Prior to Admission medications   Medication Sig Start Date End Date Taking? Authorizing Provider  Accu-Chek Softclix Lancets lancets 100 each by Other route  4 (four) times daily. Use as instructed 06/09/21  Yes Gae Dry, MD  Blood Glucose Monitoring Suppl (ACCU-CHEK NANO SMARTVIEW) w/Device KIT 1 kit by Subdermal route as directed. Check blood sugars for fasting, and two hours after breakfast, lunch and dinner (4 checks daily) 06/09/21  Yes Gae Dry, MD  FLOVENT DISKUS 250 MCG/BLIST AEPB Inhale into the lungs. 12/04/20  Yes [provider]  glucose blood (ACCU-CHEK SMARTVIEW) test strip Use as instructed to check blood sugars 4 times daily 06/09/21  Yes Gae Dry, MD  insulin detemir (LEVEMIR FLEXTOUCH) 100 UNIT/ML FlexPen Inject 10 Units into the skin at bedtime. 07/07/21  Yes Malachy Mood, MD  Insulin Pen Needle 32G X 6 MM MISC 1 Units by Does not apply route at bedtime. 07/07/21  Yes Malachy Mood, MD  Prenatal Vit-Fe Fumarate-FA (PRENATAL MULTIVITAMIN) TABS tablet Take 1 tablet by mouth daily at 12 noon.   Yes [provider]  Arvid Right 150 MG/ML prefilled syringe Inject into the  skin. 01/01/21  Yes [provider]  Arvid Right 75 MG/0.5ML prefilled syringe Inject into the skin. 11/27/20  Yes [provider]  aspirin EC 81 MG tablet Take 81 mg by mouth daily. Swallow whole. Patient not taking: Reported on 08/01/2021    [provider]  EPINEPHrine 0.3 mg/0.3 mL IJ SOAJ injection  11/06/20   [provider]    Allergies: Allergies  Allergen Reactions   Corn-Containing Products Rash    Physical Exam: Vitals: Blood pressure 135/75, pulse 87, temperature 98.6 F (37 C), temperature source Oral, resp. rate 18, height 5' 5" (1.651 m), weight 89.8 kg, last menstrual period 04/06/202  FHT: 140, moderate, +accels, no decels Toco: q77mn  General: NAD HEENT: normocephalic, anicteric Pulmonary: No increased work of breathing Cardiovascular: RRR, distal pulses 2+ Abdomen: Gravid, non-tender Leopolds: vtx Genitourinary: 1/50/-3 Extremities: no edema, erythema, or  tenderness Neurologic: Grossly intact Psychiatric: mood appropriate, affect full  Labs: Results for orders placed or performed during the hospital encounter of 07/31/21 (from the past 24 hour(s))  CBC     Status: Abnormal   Collection Time: 08/01/21 12:57 AM  Result Value Ref Range   WBC 11.8 (H) 4.0 - 10.5 K/uL   RBC 4.06 3.87 - 5.11 MIL/uL   Hemoglobin 12.7 12.0 - 15.0 g/dL   HCT 36.6 36.0 - 46.0 %   MCV 90.1 80.0 - 100.0 fL   MCH 31.3 26.0 - 34.0 pg   MCHC 34.7 30.0 - 36.0 g/dL   RDW 13.3 11.5 - 15.5 %   Platelets 308 150 - 400 K/uL   nRBC 0.0 0.0 - 0.2 %   [place]MFM FETAL BPP WO NON STRESS  Result Date: 07/15/2021 ----------------------------------------------------------------------  OBSTETRICS REPORT                       (Signed Final 07/15/2021 05:27 pm) ---------------------------------------------------------------------- Patient Info  ID #:       [id number]                         D.O.B.:  002-19-1987(35 yrs)  Name:       Jennifer BathLINENS                  Visit Date: 07/15/2021 02:35 pm ---------------------------------------------------------------------- Performed By  Attending:        VJohnell ComingsMD         Ref. Address:     [address] [place]                                                             [place] [address] Performed By:     JMaryfrances Bunnell     Location:         [facility]                                                             [place] [facility]                                                             [facility]  Referred By:      Malachy Mood MD ---------------------------------------------------------------------- Orders  #  Description                           Code        Ordered By  1  [place] MFM OB DETAIL +14 [place]               76811.01    Tanyah Debruyne  2  [place] MFM FETAL BPP WO NON                76819.01    Madelein Mahadeo     STRESS ----------------------------------------------------------------------  #  Order #                     Accession #                Episode #  1  [id number]                   [id number]                 [id number]  2  [id number]                   [id number]                 [id number] ---------------------------------------------------------------------- Indications  Gestational diabetes in pregnancy, insulin     O24.414  controlled  Advanced maternal age multigravida 59+,        O22.522  second trimester  Obesity complicating pregnancy, second         O99.212  trimester  [redacted] weeks gestation of pregnancy                Z3A.34  Encounter for antenatal screening for          Z36.3  malformations ---------------------------------------------------------------------- Fetal Evaluation  Num Of Fetuses:         1  Fetal Heart Rate(bpm):  162  Cardiac Activity:       Observed  Presentation:           Cephalic  Placenta:               Anterior  P. Cord Insertion:      Not well visualized  Amniotic Fluid  AFI FV:      Within normal limits  AFI Sum(cm)     %Tile       Largest Pocket(cm)  18.98           71  7.87  RUQ(cm)       RLQ(cm)       LUQ(cm)        LLQ(cm)  7.87          2.44          5.49           3.17 ---------------------------------------------------------------------- Biophysical Evaluation  Amniotic F.V:   Within normal limits       F. Tone:        Observed  F. Movement:    Observed                   Score:          8/8  F. Breathing:   Observed ---------------------------------------------------------------------- Biometry  BPD:      91.3  mm     G. Age:  37w 0d         95  %    CI:         78.6   %    70 - 86                                                          FL/HC:      20.4   %    20.1 - 22.3  HC:      325.7  mm     G. Age:  36w 6d         68  %    HC/AC:      0.98        0.93 - 1.11  AC:       334   mm     G. Age:  37w 2d         98  %    FL/BPD:     72.7   %     71 - 87  FL:       66.4  mm     G. Age:  34w 1d         25  %    FL/AC:      19.9   %    20 - 24  HUM:      53.9  mm     G. Age:  31w 2d        < 5  %  CER:      47.8  mm     G. Age:  36w 0d         96  %  LV:          7  mm  CM:        9.1  mm  Est. FW:    2948  gm      6 lb 8 oz     88  % ---------------------------------------------------------------------- OB History  Gravidity:    2         Term:   0  TOP:          1 ---------------------------------------------------------------------- Gestational Age  LMP:           34w 6d        Date:  11/13/20                 EDD:   08/20/21  U/S Today:  36w 2d                                        EDD:   08/10/21  Best:          34w 6d     Det. By:  LMP  (11/13/20)          EDD:   08/20/21 ---------------------------------------------------------------------- Anatomy  Cranium:               Appears normal         LVOT:                   Not well visualized  Cavum:                 Appears normal         Aortic Arch:            Not well visualized  Ventricles:            Appears normal         Ductal Arch:            Not well visualized  Choroid Plexus:        Appears normal         Diaphragm:              Appears normal  Cerebellum:            Appears normal         Stomach:                Appears normal, left                                                                        sided  Posterior Fossa:       Appears normal         Abdomen:                Appears normal  Nuchal Fold:           Not applicable (>66    Abdominal Wall:         Not well visualized                         wks GA)  Face:                  Not well visualized    Cord Vessels:           Appears normal (3                                                                        vessel cord)  Lips:                  Appears normal  Kidneys:                Appear normal  Palate:                Not well visualized    Bladder:                Appears normal  Thoracic:              Appears normal          Spine:                  Appears normal  Heart:                 Not well visualized    Upper Extremities:      RT upper ext                                                                        visualized  RVOT:                  Not well visualized    Lower Extremities:      RT lower ext                                                                        visualized  Other:  Technically difficult due to advanced GA and fetal position. ---------------------------------------------------------------------- Cervix Uterus Adnexa  Cervix  Not visualized (advanced GA >24wks)  Uterus  No abnormality visualized.  Right Ovary  Not visualized.  Left Ovary  Not visualized.  Adnexa  No adnexal mass visualized. ---------------------------------------------------------------------- Comments  This patient was seen for an ultrasound exam due to recently  diagnosed gestational diabetes that is treated with insulin.  The patient reports that her fasting fingerstick values have  been in the 104-110 range.  She denies any other problems  in her current pregnancy and reports feeling fetal movements  throughout the day.  She was informed that the fetal growth and amniotic fluid  level appears appropriate for her gestational age.  The views of the fetal anatomy were limited today due to her  advanced gestational age.  A BPP performed today was 8 out of 8.  The implications and management of diabetes in pregnancy  was discussed in detail with the patient. She was advised that  our goals for her fingerstick values are fasting values of 90-95  or less and two-hour postprandials of 120 or less.  She was  advised that her evening dose of insulin may have to be  increased to lower her fasting fingerstick values down to the  normal range.  The patient reports that she is already scheduled for weekly  fetal testing in your office.  She should continue weekly fetal  testing until delivery.  Should her glucose control continue to be poor,  delivery may  be considered at 37 weeks.  If her fingerstick values become  normalized, delivery may be  considered at 39 weeks.  No further exams were scheduled in our office. ----------------------------------------------------------------------                   Johnell Comings, MD Electronically Signed Final Report   07/15/2021 05:27 pm ----------------------------------------------------------------------  [place] MFM OB DETAIL +14 WK  Result Date: 07/15/2021 ----------------------------------------------------------------------  OBSTETRICS REPORT                       (Signed Final 07/15/2021 05:27 pm) ---------------------------------------------------------------------- Patient Info  ID #:       [id number]                          D.O.B.:  1986-07-02 (35 yrs)  Name:       Rosey Bath Batterson                  Visit Date: 07/15/2021 02:35 pm ---------------------------------------------------------------------- Performed By  Attending:        Johnell Comings MD         Ref. Address:     [address], [place],                                                             [place] [address]  Performed By:     Maryfrances Bunnell      Location[facility]         [facility]                                                             [place] [facility]                                                             [facility]  Referred By:      Malachy Mood MD ---------------------------------------------------------------------- Orders  #  Description                           Code  Ordered By  1  [place] MFM OB DETAIL +14 WK               [id number]    ANDREAS STAEBLER  2  [place] MFM FETAL BPP WO NON               76819.01    ANDREAS STAEBLER     STRESS ----------------------------------------------------------------------  #  Order #                     Accession #                Episode #  1   [id number]                   [id number]                 [id number]  2  [id number]                   [id number]                 [id number] ---------------------------------------------------------------------- Indications  Gestational diabetes in pregnancy, insulin     O24.414  controlled  Advanced maternal age multigravida 73+,        O48.522  second trimester  Obesity complicating pregnancy, second         O99.212  trimester  [redacted] weeks gestation of pregnancy                Z3A.34  Encounter for antenatal screening for          Z36.3  malformations ---------------------------------------------------------------------- Fetal Evaluation  Num Of Fetuses:         1  Fetal Heart Rate(bpm):  162  Cardiac Activity:       Observed  Presentation:           Cephalic  Placenta:               Anterior  P. Cord Insertion:      Not well visualized  Amniotic Fluid  AFI FV:      Within normal limits  AFI Sum(cm)     %Tile       Largest Pocket(cm)  18.98           71          7.87  RUQ(cm)       RLQ(cm)       LUQ(cm)        LLQ(cm)  7.87          2.44          5.49           3.17 ---------------------------------------------------------------------- Biophysical Evaluation  Amniotic F.V:   Within normal limits       F. Tone:        Observed  F. Movement:    Observed                   Score:          8/8  F. Breathing:   Observed ---------------------------------------------------------------------- Biometry  BPD:      91.3  mm     G. Age:  37w 0d         95  %    CI:         78.6   %    70 - 86  FL/HC:      20.4   %    20.1 - 22.3  HC:      325.7  mm     G. Age:  36w 6d         68  %    HC/AC:      0.98        0.93 - 1.11  AC:       334   mm     G. Age:  37w 2d         98  %    FL/BPD:     72.7   %    71 - 87  FL:       66.4  mm     G. Age:  34w 1d         25  %    FL/AC:      19.9   %    20 - 24  HUM:      53.9  mm     G. Age:  31w 2d        < 5  %  CER:      47.8  mm     G. Age:   36w 0d         78  %  LV:          7  mm  CM:        9.1  mm  Est. FW:    2948  gm      6 lb 8 oz     88  % ---------------------------------------------------------------------- OB History  Gravidity:    2         Term:   0  TOP:          1 ---------------------------------------------------------------------- Gestational Age  LMP:           34w 6d        Date:  11/13/20                 EDD:   08/20/21  U/S Today:     36w 2d                                        EDD:   08/10/21  Best:          34w 6d     Det. By:  LMP  (11/13/20)          EDD:   08/20/21 ---------------------------------------------------------------------- Anatomy  Cranium:               Appears normal         LVOT:                   Not well visualized  Cavum:                 Appears normal         Aortic Arch:            Not well visualized  Ventricles:            Appears normal         Ductal Arch:            Not well visualized  Choroid Plexus:        Appears normal         Diaphragm:  Appears normal  Cerebellum:            Appears normal         Stomach:                Appears normal, left                                                                        sided  Posterior Fossa:       Appears normal         Abdomen:                Appears normal  Nuchal Fold:           Not applicable (>63    Abdominal Wall:         Not well visualized                         wks GA)  Face:                  Not well visualized    Cord Vessels:           Appears normal (3                                                                        vessel cord)  Lips:                  Appears normal         Kidneys:                Appear normal  Palate:                Not well visualized    Bladder:                Appears normal  Thoracic:              Appears normal         Spine:                  Appears normal  Heart:                 Not well visualized    Upper Extremities:      RT upper ext                                                                         visualized  RVOT:                  Not well visualized    Lower Extremities:      RT lower ext  visualized  Other:  Technically difficult due to advanced GA and fetal position. ---------------------------------------------------------------------- Cervix Uterus Adnexa  Cervix  Not visualized (advanced GA >24wks)  Uterus  No abnormality visualized.  Right Ovary  Not visualized.  Left Ovary  Not visualized.  Adnexa  No adnexal mass visualized. ---------------------------------------------------------------------- Comments  This patient was seen for an ultrasound exam due to recently  diagnosed gestational diabetes that is treated with insulin.  The patient reports that her fasting fingerstick values have  been in the 104-110 range.  She denies any other problems  in her current pregnancy and reports feeling fetal movements  throughout the day.  She was informed that the fetal growth and amniotic fluid  level appears appropriate for her gestational age.  The views of the fetal anatomy were limited today due to her  advanced gestational age.  A BPP performed today was 8 out of 8.  The implications and management of diabetes in pregnancy  was discussed in detail with the patient. She was advised that  our goals for her fingerstick values are fasting values of 90-95  or less and two-hour postprandials of 120 or less.  She was  advised that her evening dose of insulin may have to be  increased to lower her fasting fingerstick values down to the  normal range.  The patient reports that she is already scheduled for weekly  fetal testing in your office.  She should continue weekly fetal  testing until delivery.  Should her glucose control continue to be poor, delivery may  be considered at 37 weeks.  If her fingerstick values become  normalized, delivery may be considered at 39 weeks.  No further exams were scheduled in our office.  ----------------------------------------------------------------------                   Johnell Comings, MD Electronically Signed Final Report   07/15/2021 05:27 pm ----------------------------------------------------------------------   Assessment: 35 y.o. G2P0010 60w2dby 08/20/2021, by Last Menstrual Period presenting for IOL secondary to worsening GDM, EFW 88%ile  Plan: 1) IOL - proceed with cytotec IOL - took 18U long acting evening of 07/31/2021, q4-hr accuchecks and sliding scale in latent phase.  Will evaluate need for endo tool in active labor  2) Fetus - cat I tracing  3) PNL - Blood type A/Positive/-- (06/24 1224) / Anti-bodyscreen Negative (06/24 1224) / Rubella 4.59 (06/24 1224) / Varicella Immune / RPR Non Reactive (10/14 1051) / HBsAg Negative (06/24 1224) / HIV Non Reactive (10/14 1051) / GBS Positive/-- (12/14 0230)  4) Immunization History -  Immunization History  Administered Date(s) Administered   Influenza,inj,Quad PF,6+ Mos 04/25/2021   Tdap 07/07/2021    5) Disposition - pending delivery  AMalachy Mood MD, [place] [facility] 08/01/2021, 1:25 AM

## 2021-08-02 ENCOUNTER — Encounter
Admission: EM | Disposition: A | Discharge status: Home / Self Care | Payer: Self-pay | Source: Home / Self Care | Attending: Obstetrics & Gynecology

## 2021-08-02 ENCOUNTER — Encounter: Payer: Self-pay | Admitting: Obstetrics and Gynecology

## 2021-08-02 DIAGNOSIS — Z3A37 37 weeks gestation of pregnancy: Secondary | ICD-10-CM

## 2021-08-02 DIAGNOSIS — O339 Maternal care for disproportion, unspecified: Secondary | ICD-10-CM

## 2021-08-02 DIAGNOSIS — B951 Streptococcus, group B, as the cause of diseases classified elsewhere: Secondary | ICD-10-CM

## 2021-08-02 DIAGNOSIS — O09523 Supervision of elderly multigravida, third trimester: Secondary | ICD-10-CM

## 2021-08-02 DIAGNOSIS — O98813 Other maternal infectious and parasitic diseases complicating pregnancy, third trimester: Secondary | ICD-10-CM

## 2021-08-02 DIAGNOSIS — O24414 Gestational diabetes mellitus in pregnancy, insulin controlled: Secondary | ICD-10-CM

## 2021-08-02 LAB — CBC
HCT: 28.1 % — ABNORMAL LOW (ref 36.0–46.0)
Hemoglobin: 9.8 g/dL — ABNORMAL LOW (ref 12.0–15.0)
MCH: 31.5 pg (ref 26.0–34.0)
MCHC: 34.9 g/dL (ref 30.0–36.0)
MCV: 90.4 fL (ref 80.0–100.0)
Platelets: 219 10*3/uL (ref 150–400)
RBC: 3.11 MIL/uL — ABNORMAL LOW (ref 3.87–5.11)
RDW: 13.6 % (ref 11.5–15.5)
WBC: 23.4 10*3/uL — ABNORMAL HIGH (ref 4.0–10.5)
nRBC: 0 % (ref 0.0–0.2)

## 2021-08-02 LAB — GLUCOSE, CAPILLARY: Glucose-Capillary: 103 mg/dL — ABNORMAL HIGH (ref 70–99)

## 2021-08-02 SURGERY — Surgical Case
Anesthesia: Epidural

## 2021-08-02 MED ORDER — KETOROLAC TROMETHAMINE 30 MG/ML IJ SOLN
INTRAMUSCULAR | Status: DC | PRN
  Administered 2021-08-02: 30 mg via INTRAVENOUS

## 2021-08-02 MED ORDER — OXYTOCIN-SODIUM CHLORIDE 30-0.9 UT/500ML-% IV SOLN
2.5000 [IU]/h | INTRAVENOUS | Status: AC
  Administered 2021-08-02: 12:00:00 2.5 [IU]/h via INTRAVENOUS
  Filled 2021-08-02: qty 500

## 2021-08-02 MED ORDER — KETOROLAC TROMETHAMINE 30 MG/ML IJ SOLN
INTRAMUSCULAR | Status: AC
  Filled 2021-08-02: qty 1

## 2021-08-02 MED ORDER — MORPHINE SULFATE (PF) 0.5 MG/ML IJ SOLN
INTRAMUSCULAR | Status: AC
  Filled 2021-08-02: qty 10

## 2021-08-02 MED ORDER — SIMETHICONE 80 MG PO CHEW
80.0000 mg | CHEWABLE_TABLET | Freq: Three times a day (TID) | ORAL | Status: DC
  Administered 2021-08-02 – 2021-08-04 (×6): 80 mg via ORAL
  Filled 2021-08-02 (×8): qty 1

## 2021-08-02 MED ORDER — IBUPROFEN 600 MG PO TABS
600.0000 mg | ORAL_TABLET | Freq: Four times a day (QID) | ORAL | Status: DC
  Administered 2021-08-03 – 2021-08-04 (×4): 600 mg via ORAL
  Filled 2021-08-02 (×4): qty 1

## 2021-08-02 MED ORDER — WITCH HAZEL-GLYCERIN EX PADS
1.0000 "application " | MEDICATED_PAD | CUTANEOUS | Status: DC | PRN
  Administered 2021-08-02: 1 via TOPICAL
  Filled 2021-08-02: qty 100

## 2021-08-02 MED ORDER — ONDANSETRON HCL 4 MG/2ML IJ SOLN
INTRAMUSCULAR | Status: DC | PRN
  Administered 2021-08-02: 4 mg via INTRAVENOUS

## 2021-08-02 MED ORDER — LACTATED RINGERS IV SOLN: INTRAVENOUS | Status: DC

## 2021-08-02 MED ORDER — TETANUS-DIPHTH-ACELL PERTUSSIS 5-2.5-18.5 LF-MCG/0.5 IM SUSY
0.5000 mL | PREFILLED_SYRINGE | Freq: Once | INTRAMUSCULAR | Status: DC
  Filled 2021-08-02: qty 0.5

## 2021-08-02 MED ORDER — POVIDONE-IODINE 10 % EX SWAB: 2.0000 "application " | Freq: Once | CUTANEOUS | Status: DC

## 2021-08-02 MED ORDER — SOD CITRATE-CITRIC ACID 500-334 MG/5ML PO SOLN
30.0000 mL | ORAL | Status: AC
  Administered 2021-08-02: 03:00:00 30 mL via ORAL

## 2021-08-02 MED ORDER — PRENATAL MULTIVITAMIN CH
1.0000 | ORAL_TABLET | Freq: Every day | ORAL | Status: DC
  Administered 2021-08-02 – 2021-08-03 (×2): 1 via ORAL
  Filled 2021-08-02 (×3): qty 1

## 2021-08-02 MED ORDER — SODIUM CHLORIDE 0.9 % IV SOLN
INTRAVENOUS | Status: AC
  Filled 2021-08-02: qty 2

## 2021-08-02 MED ORDER — BUPIVACAINE HCL (PF) 0.5 % IJ SOLN
INTRAMUSCULAR | Status: AC
  Filled 2021-08-02: qty 30

## 2021-08-02 MED ORDER — DIPHENHYDRAMINE HCL 25 MG PO CAPS: 25.0000 mg | ORAL_CAPSULE | Freq: Four times a day (QID) | ORAL | Status: DC | PRN

## 2021-08-02 MED ORDER — ONDANSETRON HCL 4 MG/2ML IJ SOLN
INTRAMUSCULAR | Status: AC
  Filled 2021-08-02: qty 2

## 2021-08-02 MED ORDER — BUPIVACAINE ON-Q PAIN PUMP (FOR ORDER SET NO CHG)
INJECTION | Status: DC
  Filled 2021-08-02 (×3): qty 1

## 2021-08-02 MED ORDER — KETOROLAC TROMETHAMINE 30 MG/ML IJ SOLN
30.0000 mg | Freq: Four times a day (QID) | INTRAMUSCULAR | Status: AC
  Administered 2021-08-02 – 2021-08-03 (×4): 30 mg via INTRAVENOUS
  Filled 2021-08-02 (×4): qty 1

## 2021-08-02 MED ORDER — FENTANYL-BUPIVACAINE-NACL 0.5-0.125-0.9 MG/250ML-% EP SOLN
EPIDURAL | Status: AC
  Filled 2021-08-02: qty 250

## 2021-08-02 MED ORDER — ZOLPIDEM TARTRATE 5 MG PO TABS: 5.0000 mg | ORAL_TABLET | Freq: Every evening | ORAL | Status: DC | PRN

## 2021-08-02 MED ORDER — SODIUM CHLORIDE 0.9 % IV SOLN
INTRAVENOUS | Status: AC
  Filled 2021-08-02: qty 5

## 2021-08-02 MED ORDER — BUPIVACAINE 0.25 % ON-Q PUMP DUAL CATH 400 ML
400.0000 mL | INJECTION | Status: DC
  Filled 2021-08-02: qty 400

## 2021-08-02 MED ORDER — FENTANYL CITRATE (PF) 100 MCG/2ML IJ SOLN
INTRAMUSCULAR | Status: AC
  Filled 2021-08-02: qty 2

## 2021-08-02 MED ORDER — BUPIVACAINE 0.25 % ON-Q PUMP DUAL CATH 400 ML
400.0000 mL | INJECTION | Status: DC
Start: 2021-08-02 — End: 2021-08-02

## 2021-08-02 MED ORDER — FENTANYL CITRATE (PF) 100 MCG/2ML IJ SOLN
INTRAMUSCULAR | Status: DC | PRN
  Administered 2021-08-02: 100 ug via INTRAVENOUS

## 2021-08-02 MED ORDER — LIDOCAINE 2% (20 MG/ML) 5 ML SYRINGE
INTRAMUSCULAR | Status: DC | PRN
  Administered 2021-08-02 (×2): 100 mg via INTRAVENOUS
  Administered 2021-08-02: 20 mg via INTRAVENOUS
  Administered 2021-08-02: 60 mg via INTRAVENOUS
  Administered 2021-08-02: 100 mg via INTRAVENOUS

## 2021-08-02 MED ORDER — SENNOSIDES-DOCUSATE SODIUM 8.6-50 MG PO TABS
2.0000 | ORAL_TABLET | ORAL | Status: DC
  Administered 2021-08-02 – 2021-08-04 (×3): 2 via ORAL
  Filled 2021-08-02 (×3): qty 2

## 2021-08-02 MED ORDER — COCONUT OIL OIL
1.0000 "application " | TOPICAL_OIL | Status: DC | PRN
  Administered 2021-08-02: 1 via TOPICAL
  Filled 2021-08-02: qty 120

## 2021-08-02 MED ORDER — MENTHOL 3 MG MT LOZG
1.0000 | LOZENGE | OROMUCOSAL | Status: DC | PRN
  Filled 2021-08-02: qty 9

## 2021-08-02 MED ORDER — MORPHINE SULFATE (PF) 0.5 MG/ML IJ SOLN
INTRAMUSCULAR | Status: DC | PRN
  Administered 2021-08-02: 1 mg via EPIDURAL
  Administered 2021-08-02: 2 mg via EPIDURAL

## 2021-08-02 MED ORDER — SOD CITRATE-CITRIC ACID 500-334 MG/5ML PO SOLN
ORAL | Status: AC
  Filled 2021-08-02: qty 15

## 2021-08-02 MED ORDER — PENICILLIN G POT IN DEXTROSE 60000 UNIT/ML IV SOLN
INTRAVENOUS | Status: AC
  Filled 2021-08-02: qty 50

## 2021-08-02 MED ORDER — DIBUCAINE (PERIANAL) 1 % EX OINT
1.0000 "application " | TOPICAL_OINTMENT | CUTANEOUS | Status: DC | PRN
  Administered 2021-08-02: 1 via RECTAL
  Filled 2021-08-02: qty 28

## 2021-08-02 MED ORDER — SODIUM CHLORIDE 0.9 % IV SOLN
2.0000 g | INTRAVENOUS | Status: AC
  Administered 2021-08-02: 03:00:00 2 g via INTRAVENOUS

## 2021-08-02 MED ORDER — ACETAMINOPHEN 325 MG PO TABS: 650.0000 mg | ORAL_TABLET | ORAL | Status: DC | PRN

## 2021-08-02 MED ORDER — BUPIVACAINE HCL 0.5 % IJ SOLN
10.0000 mL | Freq: Once | INTRAMUSCULAR | Status: DC
Start: 2021-08-02 — End: 2021-08-02

## 2021-08-02 MED ORDER — SIMETHICONE 80 MG PO CHEW: 80.0000 mg | CHEWABLE_TABLET | ORAL | Status: DC | PRN

## 2021-08-02 MED ORDER — SODIUM CHLORIDE 0.9 % IV SOLN: 500.0000 mg | Freq: Once | INTRAVENOUS | Status: DC

## 2021-08-02 MED ORDER — BUPIVACAINE HCL (PF) 0.5 % IJ SOLN
INTRAMUSCULAR | Status: DC | PRN
  Administered 2021-08-02: 30 mL

## 2021-08-02 MED ORDER — OXYCODONE HCL 5 MG PO TABS: 5.0000 mg | ORAL_TABLET | ORAL | Status: DC | PRN

## 2021-08-02 MED ORDER — LIDOCAINE HCL 2 % IJ SOLN
INTRAMUSCULAR | Status: AC
  Filled 2021-08-02: qty 40

## 2021-08-02 MED ORDER — MORPHINE SULFATE (PF) 2 MG/ML IV SOLN: 1.0000 mg | INTRAVENOUS | Status: DC | PRN

## 2021-08-02 SURGICAL SUPPLY — 29 items
BARRIER ADHS 3X4 INTERCEED (GAUZE/BANDAGES/DRESSINGS) ×2 IMPLANT
CATH KIT ON-Q SILVERSOAK 5 (CATHETERS) ×2 IMPLANT
CATH KIT ON-Q SILVERSOAK 5IN (CATHETERS) ×6 IMPLANT
CHLORAPREP W/TINT 26 (MISCELLANEOUS) ×6 IMPLANT
DERMABOND ADHESIVE PROPEN (GAUZE/BANDAGES/DRESSINGS) ×4
DERMABOND ADVANCED (GAUZE/BANDAGES/DRESSINGS) ×4
DERMABOND ADVANCED .7 DNX12 (GAUZE/BANDAGES/DRESSINGS) ×1 IMPLANT
DERMABOND ADVANCED .7 DNX6 (GAUZE/BANDAGES/DRESSINGS) IMPLANT
ELECT CAUTERY BLADE 6.4 (BLADE) ×3 IMPLANT
ELECT REM PT RETURN 9FT ADLT (ELECTROSURGICAL) ×3
ELECTRODE REM PT RTRN 9FT ADLT (ELECTROSURGICAL) ×1 IMPLANT
GLOVE SURG NEOPR MICRO LF SZ8 (GLOVE) ×3 IMPLANT
GOWN STRL REUS W/ TWL LRG LVL3 (GOWN DISPOSABLE) ×1 IMPLANT
GOWN STRL REUS W/ TWL XL LVL3 (GOWN DISPOSABLE) ×2 IMPLANT
GOWN STRL REUS W/TWL LRG LVL3 (GOWN DISPOSABLE) ×2
GOWN STRL REUS W/TWL XL LVL3 (GOWN DISPOSABLE) ×4
MANIFOLD NEPTUNE II (INSTRUMENTS) ×3 IMPLANT
MAT PREVALON FULL STRYKER (MISCELLANEOUS) ×3 IMPLANT
NS IRRIG 1000ML POUR BTL (IV SOLUTION) ×3 IMPLANT
PACK C SECTION AR (MISCELLANEOUS) ×3 IMPLANT
PAD OB MATERNITY 4.3X12.25 (PERSONAL CARE ITEMS) ×3 IMPLANT
PAD PREP 24X41 OB/GYN DISP (PERSONAL CARE ITEMS) ×3 IMPLANT
PENCIL SMOKE EVACUATOR (MISCELLANEOUS) ×3 IMPLANT
SCRUB EXIDINE 4% CHG 4OZ (MISCELLANEOUS) ×3 IMPLANT
SUT MAXON ABS #0 GS21 30IN (SUTURE) ×6 IMPLANT
SUT VIC AB 1 CT1 36 (SUTURE) ×9 IMPLANT
SUT VIC AB 2-0 CT1 36 (SUTURE) ×3 IMPLANT
SUT VIC AB 4-0 FS2 27 (SUTURE) ×3 IMPLANT
WATER STERILE IRR 500ML POUR (IV SOLUTION) ×3 IMPLANT

## 2021-08-02 NOTE — Op Note (Signed)
Cesarean Section Procedure Note Indications: cephalo-pelvic disproportion, failure to progress: arrest of descent, and term intrauterine pregnancy  Pre-operative Diagnosis: Intrauterine pregnancy [redacted]w[redacted]d ;  cephalo-pelvic disproportion, failure to progress: arrest of descent, and term intrauterine pregnancy Post-operative Diagnosis: same, delivered. Procedure: Low Transverse Cesarean Section Surgeon: Annamarie Major, MD, FACOG Assistant(s): Rosezetta Schlatter, CNM, No other capable assistant available, in surgery requiring high level assistant. Anesthesia: Spinal anesthesia Estimated Blood Loss:1000 Complications: None; patient tolerated the procedure well. Disposition: PACU - hemodynamically stable. Condition: stable  Findings: A female infant in the cephalic presentation. "Isla" Amniotic fluid - Meconium  Birth weight 7-15 lbs.  Apgars of 8 and 9.  Intact placenta with a three-vessel cord. Grossly normal uterus, tubes and ovaries bilaterally. No intraabdominal adhesions were noted.  Procedure Details   The patient was taken to Operating Room, identified as the correct patient and the procedure verified as C-Section Delivery. A Time Out was held and the above information confirmed. After induction of anesthesia, the patient was draped and prepped in the usual sterile manner. A Pfannenstiel incision was made and carried down through the subcutaneous tissue to the fascia. Fascial incision was made and extended transversely with the Mayo scissors. The fascia was separated from the underlying rectus tissue superiorly and inferiorly. The peritoneum was identified and entered bluntly. Peritoneal incision was extended longitudinally. The utero-vesical peritoneal reflection was incised transversely and a bladder flap was created digitally.  A low transverse hysterotomy was made. The fetus was delivered atraumatically. The umbilical cord was clamped x2 and cut and the infant was handed to the awaiting  pediatricians. The placenta was removed intact and appeared normal with a 3-vessel cord.  The uterus was exteriorized and cleared of all clot and debris. The hysterotomy was closed with running sutures of 0 Vicryl suture. A second imbricating layer was placed with the same suture. Excellent hemostasis was observed. The uterus was returned to the abdomen. The pelvis was irrigated and again, excellent hemostasis was noted.  The On Q Pain pump System was then placed.  Trocars were placed through the abdominal wall into the subfascial space and these were used to thread the silver soaker cathaters into place.The rectus fascia was then reapproximated with running sutures of Maxon, with careful placement not to incorporate the cathaters. Subcutaneous tissues are then irrigated with saline and hemostasis assured.  Skin is then closed with 4-0 vicryl suture in a subcuticular fashion followed by skin adhesive. The cathaters are flushed each with 5 mL of Bupivicaine and stabilized into place with dressing.  The surgical assistant performed tissue retraction, assistance with suturing, and fundal pressure.   Instrument, sponge, and needle counts were correct prior to the abdominal closure and at the conclusion of the case.  The patient tolerated the procedure well and was transferred to the recovery room in stable condition.   Annamarie Major, MD, [facility], [facility] 08/02/2021  3:59 AM

## 2021-08-02 NOTE — Discharge Summary (Signed)
Postpartum Discharge Summary  Date of Service updated: 08/04/2021     Patient Name: Jennifer Pearson DOB: 1986-05-19 MRN: [id number]  Date of admission: 07/31/2021 Delivery date:08/02/2021  Delivering provider: Gae Dry  Date of discharge: 08/04/2021  Admitting diagnosis: Insulin controlled gestational diabetes mellitus (GDM) in third trimester [O24.414] Intrauterine pregnancy: [redacted]w[redacted]d    Secondary diagnosis:  Principal Problem:   Insulin controlled gestational diabetes mellitus (GDM) in third trimester Active Problems:   Multigravida of advanced maternal age in second trimester   Cephalopelvic disproportion, delivered, current hospitalization   [redacted] weeks gestation of pregnancy   Encounter for care or examination of lactating mother   Postpartum care following cesarean delivery   Status post primary low transverse cesarean section  Additional problems: none    Discharge diagnosis: GDM A2                                              Post partum procedures: none Augmentation: AROM and Pitocin Complications: None  Hospital course: Induction of Labor With Cesarean Section   35y.o. yo G2P0010 at 333w3das admitted to the hospital 07/31/2021 for induction of labor. Patient had a labor course significant for dilation to 10 cm but lack of descent with pushing. The patient went for cesarean section due to Arrest of Descent. Delivery details are as follows: Membrane Rupture Time/Date: 5:41 AM ,08/01/2021   Delivery Method:C-Section, Low Transverse  Details of operation can be found in separate operative Note.  Patient had an uncomplicated postpartum course. She is ambulating, tolerating a regular diet, passing flatus, and urinating well.  Patient is discharged home in stable condition on 08/04/21.      Newborn Data: Birth date:08/02/2021  Birth time:3:23 AM  [place] status:Living  Apgars:8 ,9  Weight:3590 g                                Magnesium  Sulfate received: No BMZ received: No Rhophylac:N/A MMR:No T-DaP:Given prenatally Flu: Yes Transfusion:No  Physical exam  Vitals:   08/03/21 1500 08/03/21 1917 08/03/21 2331 08/04/21 0741  BP: 116/64 128/73 127/71 120/63  Pulse: 83 83 85 76  Resp: '18 18 18 16  ' Temp: 97.9 F (36.6 C) 98.5 F (36.9 C) 98.6 F (37 C) 98.5 F (36.9 C)  TempSrc: Oral Oral Oral Oral  SpO2: 100% 100% 100% 100%  Weight:      Height:       General: alert, cooperative, and no distress Lochia: appropriate Uterine Fundus: firm Incision: Healing well with no significant drainage, On Q with serous drainage DVT Evaluation: No evidence of DVT seen on physical exam. Labs: Lab Results  Component Value Date   WBC 23.4 (H) 08/02/2021   HGB 9.8 (L) 08/02/2021   HCT 28.1 (L) 08/02/2021   MCV 90.4 08/02/2021   PLT 219 08/02/2021   CMP Latest Ref Rng & Units 08/01/2021  Glucose 70 - 99 mg/dL 111(H)  BUN 6 - 20 mg/dL 16  Creatinine 0.44 - 1.00 mg/dL 0.74  Sodium 135 - 145 mmol/L 137  Potassium 3.5 - 5.1 mmol/L 3.9  Chloride 98 - 111 mmol/L 109  CO2 22 - 32 mmol/L 22  Calcium 8.9 - 10.3 mg/dL 9.5  Total Protein 6.5 - 8.1 g/dL 7.0  Total Bilirubin 0.3 - 1.2 mg/dL 0.4  Alkaline Phos 38 - 126 U/L 110  AST 15 - 41 U/L 25  ALT 0 - 44 U/L 16   Edinburgh Score: Edinburgh Postnatal Depression Scale Screening Tool 08/02/2021  I have been able to laugh and see the funny side of things. 0  I have looked forward with enjoyment to things. 0  I have blamed myself unnecessarily when things went wrong. 0  I have been anxious or worried for no good reason. 2  I have felt scared or panicky for no good reason. 0  Things have been getting on top of me. 0  I have been so unhappy that I have had difficulty sleeping. 0  I have felt sad or miserable. 0  I have been so unhappy that I have been crying. 0  The thought of harming myself has occurred to me. 0  Edinburgh Postnatal Depression Scale Total 2      After  visit meds:  Allergies as of 08/04/2021   No Active Allergies      Medication List     TAKE these medications    EPINEPHrine 0.3 mg/0.3 mL Soaj injection Commonly known as: EPI-PEN   Flovent Diskus 250 MCG/ACT Aepb Generic drug: Fluticasone Propionate (Inhal) Inhale into the lungs.   norethindrone 0.35 MG tablet Commonly known as: MICRONOR Take 1 tablet (0.35 mg total) by mouth daily. Start taking on: September 07, 2021   oxyCODONE 5 MG immediate release tablet Commonly known as: Oxy IR/ROXICODONE Take 1-2 tablets (5-10 mg total) by mouth every 6 (six) hours as needed for up to 5 days for severe pain.   prenatal multivitamin Tabs tablet Take 1 tablet by mouth daily at 12 noon.   Xolair 75 MG/0.5ML prefilled syringe Generic drug: omalizumab Inject into the skin.   Xolair 150 MG/ML prefilled syringe Generic drug: omalizumab Inject into the skin.               Discharge Care Instructions  (From admission, onward)           Start     Ordered   08/04/21 0000  Discharge wound care:       Comments: Keep incision dry, clean.   08/04/21 1128             Discharge home in stable condition Infant Feeding: Breast Infant Disposition:home with mother Discharge instruction: per After Visit Summary and Postpartum booklet. Activity: Advance as tolerated. Pelvic rest for 6 weeks.  Diet: carb modified diet Anticipated Birth Control: POPs Postpartum Appointment:6 weeks Additional Postpartum F/U: Incision check 1 week Future Appointments:No future appointments. Follow up Visit:  Follow-up Information     Gae Dry, MD. Schedule an appointment as soon as possible for a visit in 1 week(s).   Specialty: Obstetrics and Gynecology Why: Please call tomorrow morning to schedule a one week Post Op appointment Contact information: [address] [place] [address] [phone]                  08/04/2021 Rod Can, CNM

## 2021-08-02 NOTE — Progress Notes (Signed)
°  Labor Progress Note   35 y.o. G2P0010 @ [redacted]w[redacted]d , admitted for  Pregnancy, Labor Management.   Subjective:  Pain improved after redose of epidural, still has hot spot LLQ and feeling to push  Objective:  BP 126/70    Pulse 86    Temp 99.1 F (37.3 C) (Oral)    Resp 16    Ht 5\' 5"  (1.651 m)    Wt 89.8 kg    LMP 11/13/2020 (Exact Date)    SpO2 98%    BMI 32.95 kg/m  Abd: gravid, ND, FHT present, moderate tenderness on exam Extr: trace to 1+ bilateral pedal edema SVE: C/C/0+ Minimal further descent w pushing efforts  EFM: FHR: 140 bpm, variability: moderate,  accelerations:  Present,  decelerations:  Absent Toco: Frequency: Every 3-4 minutes Labs: I have reviewed the patient's lab results.   Assessment & Plan:  G2P0010 @ [redacted]w[redacted]d, admitted for  Pregnancy and Labor/Delivery Management  1. Pain management: epidural. 2. FWB: FHT category 1.  3. ID: GBS negative 4. Labor management: Arrest of descent in second stage of several hours Options for CS discussed  The risks of cesarean section discussed with the patient included but were not limited to: bleeding which may require transfusion or reoperation; infection which may require antibiotics; injury to bowel, bladder, ureters or other surrounding organs; injury to the fetus; need for additional procedures including hysterectomy in the event of a life-threatening hemorrhage; placental abnormalities wth subsequent pregnancies, incisional problems, thromboembolic phenomenon and other postoperative/anesthesia complications. The patient concurred with the proposed plan, giving informed written consent for the procedure.    All discussed with patient, see orders  [redacted]w[redacted]d, MD, Annamarie Major Ob/Gyn, [facility] 08/02/2021  2:22 AM

## 2021-08-02 NOTE — Progress Notes (Signed)
Patient pushed with good effort for nearly 3 hours with minimal progress. She began having constant back pain. Anesthesia was called to assess. Patient had some relief from bolus, however, the pain increased when Dr Tiburcio Pea came to assess for the possibility of a vacuum assisted delivery vs need for c/section. It was determined that baby's station was too high for vacuum. We will proceed to c/section delivery. Patient is in agreement.   Tresea Mall, CNM

## 2021-08-02 NOTE — Transfer of Care (Addendum)
Immediate Anesthesia Transfer of Care Note  Patient: Jennifer Pearson  Procedure(s) Performed: CESAREAN SECTION  Patient Location: LDR 1  Anesthesia Type:Epidural  Level of Consciousness: awake, alert  and oriented  Airway & Oxygen Therapy: spontaneous  Post-op Assessment: Report given to RN and Post -op Vital signs reviewed and stable  Post vital signs: Reviewed and stable  Last Vitals:  Vitals Value Taken Time  BP 118/50 08/02/21 0405  Temp 36.1 C 08/02/21 0405  Pulse 92 08/02/21 0405  Resp 20 08/02/21 0405  SpO2      Last Pain:  Vitals:   08/01/21 2251  TempSrc: Oral  PainSc:       Patients Stated Pain Goal: 0 (08/01/21 0600)  Complications: No notable events documented.

## 2021-08-02 NOTE — Anesthesia Postprocedure Evaluation (Signed)
Anesthesia Post Note  Patient: Jennifer Pearson  Procedure(s) Performed: CESAREAN SECTION  Patient location during evaluation: PACU Anesthesia Type: Epidural Level of consciousness: awake and alert Pain management: pain level controlled Vital Signs Assessment: post-procedure vital signs reviewed and stable Respiratory status: spontaneous breathing, nonlabored ventilation and respiratory function stable Cardiovascular status: stable Postop Assessment: no headache, no backache and epidural receding Anesthetic complications: no   No notable events documented.   Last Vitals:  Vitals:   08/01/21 2251 08/02/21 0405  BP:  (!) 118/50  Pulse:  92  Resp:  20  Temp: 37.3 C (!) 36.1 C  SpO2:      Last Pain:  Vitals:   08/01/21 2251  TempSrc: Oral  PainSc:                  Jaymond Waage

## 2021-08-03 ENCOUNTER — Encounter: Payer: Self-pay | Admitting: Obstetrics & Gynecology

## 2021-08-03 MED ORDER — KETOROLAC TROMETHAMINE 30 MG/ML IJ SOLN
INTRAMUSCULAR | Status: AC
  Administered 2021-08-03: 11:00:00 30 mg
  Filled 2021-08-03: qty 1

## 2021-08-03 NOTE — Progress Notes (Signed)
Per patient she is not allergic to corn. She states she eats it "all the time" with no complaints and requests it be removed from her chart. She states that she had an skin reaction as a child but this is no longer clinically relevant. This RN removed the allergy.   [name] 08/03/2021 8:43 AM

## 2021-08-03 NOTE — Progress Notes (Signed)
Admit Date: 07/31/2021 Today's Date: 08/03/2021  Subjective: Postpartum Day 1: Cesarean Delivery Patient reports tolerating PO, + flatus, and no problems voiding.    Objective: Vital signs in last 24 hours: Temp:  [97.6 F (36.4 C)-98.5 F (36.9 C)] 97.6 F (36.4 C) (12/25 0715) Pulse Rate:  [77-94] 88 (12/25 0715) Resp:  [18-20] 18 (12/25 0715) BP: (111-123)/(66-88) 123/88 (12/25 0715) SpO2:  [96 %-100 %] 99 % (12/25 0359)  Physical Exam:  General: alert, cooperative, and no distress Lochia: appropriate Uterine Fundus: firm Incision: healing well, no significant drainage, no dehiscence, no significant erythema DVT Evaluation: No evidence of DVT seen on physical exam. Negative Homan's sign. No cords or calf tenderness. No significant calf/ankle edema.  Recent Labs    08/01/21 0057 08/02/21 0749  HGB 12.7 9.8*  HCT 36.6 28.1*    Assessment/Plan: Status post Cesarean section. Doing well postoperatively.  Continue current care. Monitor for s/sx anemia.  PNV. Breast feeding Plans POP for contraception  Jennifer Pearson 08/03/2021, 9:46 AM

## 2021-08-04 DIAGNOSIS — Z3A37 37 weeks gestation of pregnancy: Secondary | ICD-10-CM

## 2021-08-04 DIAGNOSIS — Z98891 History of uterine scar from previous surgery: Secondary | ICD-10-CM

## 2021-08-04 MED ORDER — NORETHINDRONE 0.35 MG PO TABS: 1.0000 | ORAL_TABLET | Freq: Every day | ORAL | 11 refills | Status: DC

## 2021-08-04 MED ORDER — OXYCODONE HCL 5 MG PO TABS: 5.0000 mg | ORAL_TABLET | Freq: Four times a day (QID) | ORAL | 0 refills | Status: AC | PRN

## 2021-08-04 NOTE — Progress Notes (Signed)
Pt discharged with infant. Discharge instructions, prescriptions, and follow up appointments given to and reviewed with patient. Pt verbalized understanding. To be escorted out by auxillary.  °

## 2021-08-04 NOTE — Lactation Note (Signed)
This note was copied from a baby's chart. Lactation Consultation Note  Patient Name: Jennifer Pearson [id number] Date: 08/04/2021 Reason for consult: Initial assessment;Primapara;Early term 37-38.6wks Age:35 hours  Initial lactation visit prior to anticipated discharge later today. Mom is P1, c/s delivery 54 hours ago. Baby was born at [redacted]w[redacted]d which may be impactful to feeding behaviors. Mom had desired breastfeeding, but has given formula due to perception that baby was not getting enough. Mom was also set up with a DEBP at bedside for additional stimulation to aid in building and establishing a milk supply for baby. LC discussed with parents newborn stomach size, feeding patterns and behaviors, consistency of colostrum, progression of colostrum to mature milk, milk supply and demand, and feeding behaviors of late preterm babies. Mom voices desire to continue working with putting baby to breast and pumping so that she can bottle feed breastmilk as well (in anticipation of returning to work). Mom has a spectra at home. We discussed importance of frequent breast stimulation through baby and pumping, emptying of breast appropriately to encourage increasing in supply, and spending time skin to skin with baby. Mom verbalized understanding of education given.  Outpatient lactation services provided, encouraged to call for ongoing support as needed.  Maternal Data Has patient been taught Hand Expression?: Yes Does the patient have breastfeeding experience prior to this delivery?: No  Feeding Mother's Current Feeding Choice: Breast Milk Nipple Type: Slow - flow  LATCH Score                    Lactation Tools Discussed/Used Tools: Bottle;Pump Breast pump type: Double-Electric Breast Pump Reason for Pumping: stimulation  Interventions Interventions: Breast feeding basics reviewed;Hand express;Skin to skin;Education;DEBP  Discharge Discharge Education: Engorgement and breast  care;Warning signs for feeding baby;Outpatient recommendation Pump: Personal (Spectra)  Consult Status Consult Status: Complete    Danford Bad 08/04/2021, 10:24 AM

## 2021-08-07 ENCOUNTER — Encounter: Payer: BC Managed Care – PPO | Admitting: Obstetrics & Gynecology

## 2021-08-12 ENCOUNTER — Encounter: Payer: BC Managed Care – PPO | Admitting: Obstetrics and Gynecology

## 2021-08-19 ENCOUNTER — Encounter: Payer: Self-pay | Admitting: Obstetrics & Gynecology

## 2021-08-19 ENCOUNTER — Other Ambulatory Visit: Payer: Self-pay

## 2021-08-19 ENCOUNTER — Ambulatory Visit (INDEPENDENT_AMBULATORY_CARE_PROVIDER_SITE_OTHER): Payer: BC Managed Care – PPO | Admitting: Obstetrics & Gynecology

## 2021-08-19 VITALS — BP 128/80 | Ht 65.0 in | Wt 172.0 lb

## 2021-08-19 DIAGNOSIS — Z98891 History of uterine scar from previous surgery: Secondary | ICD-10-CM

## 2021-08-19 DIAGNOSIS — O24410 Gestational diabetes mellitus in pregnancy, diet controlled: Secondary | ICD-10-CM

## 2021-08-19 NOTE — Progress Notes (Signed)
°  Postoperative Follow-up Patient presents post op from recent Cesarean Section performed for Arrest of Dilation, 2 weeks ago.   Subjective: Patient reports marked improvement in her immediate post op symptoms. Eating a regular diet without difficulty. The patient is not having any pain.  Activity: sedentary. Patient reports additional symptom's since surgery of appropriate lochia, no signs of depression, and no signs of mastitis.  Objective: BP 128/80    Ht 5\' 5"  (1.651 m)    Wt 172 lb (78 kg)    Breastfeeding Yes    BMI 28.62 kg/m  Physical Exam Constitutional:      General: She is not in acute distress.    Appearance: She is well-developed.  Cardiovascular:     Rate and Rhythm: Normal rate.  Pulmonary:     Effort: Pulmonary effort is normal.  Abdominal:     General: There is no distension.     Palpations: Abdomen is soft.     Tenderness: There is no abdominal tenderness.     Comments: Incision Healing Well   Musculoskeletal:        General: Normal range of motion.  Neurological:     Mental Status: She is alert and oriented to person, place, and time.     Cranial Nerves: No cranial nerve deficit.  Skin:    General: Skin is warm and dry.    Assessment: s/p : Cesarean Section for Arrest of Dilation progressing well  Plan: Patient has done well after her Cesarean Section with no apparent complications.  I have discussed the post-operative course to date, and the expected progress moving forward.  The patient understands what complications to be concerned about.  I will see the patient in routine follow up, or sooner if needed.    Activity plan: No heavy lifting.  Pelvic rest. She desires oral progesterone-only contraceptive for postpartum contraception. No s/sx PPD Breast pumping Plan Glucola next visit  Marland Kitchen 08/19/2021, 9:56 AM

## 2021-08-20 ENCOUNTER — Inpatient Hospital Stay: Admit: 2021-08-20 | Payer: Self-pay

## 2021-09-11 ENCOUNTER — Ambulatory Visit: Payer: BC Managed Care – PPO | Admitting: Obstetrics & Gynecology

## 2021-09-17 ENCOUNTER — Other Ambulatory Visit: Payer: Self-pay

## 2021-09-17 ENCOUNTER — Ambulatory Visit (INDEPENDENT_AMBULATORY_CARE_PROVIDER_SITE_OTHER): Payer: BC Managed Care – PPO | Admitting: Obstetrics & Gynecology

## 2021-09-17 ENCOUNTER — Encounter: Payer: Self-pay | Admitting: Obstetrics & Gynecology

## 2021-09-17 ENCOUNTER — Other Ambulatory Visit: Payer: BC Managed Care – PPO

## 2021-09-17 DIAGNOSIS — O24410 Gestational diabetes mellitus in pregnancy, diet controlled: Secondary | ICD-10-CM

## 2021-09-17 NOTE — Progress Notes (Signed)
°  OBSTETRICS POSTPARTUM CLINIC PROGRESS NOTE  Subjective:     Jennifer Pearson is a 36 y.o. G100P1011 female who presents for a postpartum visit. She is 6 weeks postpartum following a Term pregnancy, Single fetus, or Pregnancy complicated by: diabetes and delivery by C-section failure to progress.  I have fully reviewed the prenatal and intrapartum course. Anesthesia: epidural.  Postpartum course has been complicated by uncomplicated.  Baby is feeding by Breast.  Bleeding: patient has not  resumed menses.  Bowel function is normal. Bladder function is normal.  Patient is not sexually active. Contraception method desired is oral progesterone-only contraceptive.  Postpartum depression screening: positive. Mostly for anxiety.  Pt fears SIDS; has experience with work/aquaintence with this result in past. Edinburgh 12.  The following portions of the patient's history were reviewed and updated as appropriate: allergies, current medications, past family history, past medical history, past social history, past surgical history, and problem list.  Review of Systems Pertinent items are noted in HPI.  Objective:    BP 118/60    Ht 5\' 5"  (1.651 m)    Wt 169 lb (76.7 kg)    Breastfeeding Yes    BMI 28.12 kg/m   General:  alert and no distress   Breasts:  inspection negative, no nipple discharge or bleeding, no masses or nodularity palpable  Lungs: clear to auscultation bilaterally  Heart:  regular rate and rhythm, S1, S2 normal, no murmur, click, rub or gallop  Abdomen: soft, non-tender; bowel sounds normal; no masses,  no organomegaly.  Well healed Pfannenstiel incision   Vulva:  normal  Vagina: normal vagina, no discharge, exudate, lesion, or erythema  Cervix:  no cervical motion tenderness and no lesions  Corpus: normal size, contour, position, consistency, mobility, non-tender  Adnexa:  normal adnexa and no mass, fullness, tenderness  Rectal Exam: Not performed.        Assessment:  Post Partum  Care visit 1. Postpartum care following cesarean delivery  Plan:  See orders and Patient Instructions Resume all normal activities Follow up in: a few months for PAP or as needed.  Discussed anxiety and its etiology, and its treatment options    Rec support and counseling; medicine not as likely to help with current sx's of anxiety mostly.  No depression, no neglect, no SI or HI.   Barnett Applebaum, MD, [facility]ra Pardon Ob/G[facility], [facility] 09/17/2021  9:53 AM

## 2021-09-18 ENCOUNTER — Encounter: Payer: Self-pay | Admitting: Obstetrics & Gynecology

## 2021-09-18 LAB — GLUCOSE TOLERANCE, 2 HOURS
Glucose, 2 hour: 147 mg/dL — ABNORMAL HIGH (ref 70–139)
Glucose, GTT - Fasting: 86 mg/dL (ref 70–99)

## 2021-10-08 ENCOUNTER — Other Ambulatory Visit: Payer: Self-pay

## 2022-09-24 ENCOUNTER — Other Ambulatory Visit: Payer: Self-pay | Admitting: Obstetrics and Gynecology

## 2022-10-06 NOTE — H&P (Signed)
Preoperative History and Physical   Jennifer Pearson is a 37 y.o. G2P1011 here for surgical management of desire for sterilization.   No significant preoperative concerns.   History of Present Illness:  37 y.o. G19P1011 female who presents to discuss contraception. She has been taking pills since she was in her teens (nearly 20 years).  She no longer wants to take the pill and she is worried the level of protection from pills.     She is currently on a combined oral contraceptive and desiring sterilization in the form of salpingectomy.  She has a past medical history significant for no contraindication to estrogen.  She specifically denies a history of migraine with aura, chronic hypertension, history of DVT/PE, and smoking.  Reported Patient's last menstrual period was 09/19/2022 (exact date)..     Proposed surgery: Robot assisted laparoscopic bilateral salpingectomy       Past Medical History:  Diagnosis Date   Abnormal Pap smear of cervix 08/11/2016    ASCUS/neg HPV   Allergy     Anxiety     Asthma, unspecified asthma severity, unspecified whether complicated, unspecified whether persistent     BRCA negative 2014   COVID-19 10/2019   Eczema, unspecified     Family history of breast cancer      MGM, PGM   Fracture     Gestational diabetes     Headache, unspecified     Hemorrhoids     Stomach ulcer           Past Surgical History:  Procedure Laterality Date   EXTRACTION TEETH Bilateral 2003   COLPOSCOPY   2018   CESAREAN SECTION   08/02/2021    Dr. Teresa Coombs                     OB History  Durenda Age Para Term Preterm AB Living  '2 1 1   1 1  '$ SAB IAB Ectopic Molar Multiple Live Births     1       1     # Outcome Date GA Lbr Len/2nd Weight Sex Delivery Anes PTL Lv  2 Term 08/02/21 [redacted]w[redacted]d  3.59 kg (7 lb 14.6 oz) F CS-LTranv EPI   LIV  1 IAB                    Patient denies any other pertinent gynecologic issues.          Current Outpatient Medications on File Prior to  Visit  Medication Sig Dispense Refill   drospirenone-ethinyl estradioL (NIKKI, 28,) 3-0.02 mg tablet Take 1 tablet by mouth once daily 90 tablet 3   EPINEPHrine (EPIPEN) 0.3 mg/0.3 mL auto-injector         omalizumab (XOLAIR) 150 mg vial Inject 3 mLs (375 mg total) subcutaneously every 28 (twenty-eight) days 1 mL 1   omalizumab (XOLAIR) 150 mg/mL inj syringe Inject 2 mLs (300 mg total) subcutaneously every 14 (fourteen) days 2 mL 11   omalizumab (XOLAIR) 75 mg/0.5 mL inj syringe Inject 0.5 mLs (75 mg total) subcutaneously every 14 (fourteen) days 1 mL 5   omalizumab (XOLAIR) 75 mg/0.5 mL inj syringe Inject 0.5 mLs (75 mg total) subcutaneously every 14 (fourteen) days 1 mL 11   sertraline (ZOLOFT) 100 MG tablet TAKE ONE (1) TABLET BY MOUTH ONCE DAILY 90 tablet 2   albuterol 90 mcg/actuation inhaler Inhale 2 inhalations into the lungs every 6 (six) hours as needed for Wheezing (Patient not  taking: Reported on 11/25/2021) 1 Inhaler 1   prenatal vitamin with iron-folic acid (PRENATAL TABLETS) tablet Take 1 tablet by mouth once daily (Patient not taking: Reported on 11/25/2021)        No current facility-administered medications on file prior to visit.    No Known Allergies   Social History:   reports that she has never smoked. She has never used smokeless tobacco. She reports current alcohol use of about 1.0 standard drink of alcohol per week. She reports that she does not use drugs.        Family History  Problem Relation Age of Onset   Allergies Mother     Heart disease Mother     Myocardial Infarction (Heart attack) Father     Coronary Artery Disease (Blocked arteries around heart) Father     High blood pressure (Hypertension) Father     High blood pressure (Hypertension) Maternal Grandmother     Breast cancer Maternal Grandmother 60   High blood pressure (Hypertension) Maternal Grandfather     High blood pressure (Hypertension) Paternal Grandmother     Breast cancer Paternal Grandmother  60   Hip fracture Paternal Grandmother     High blood pressure (Hypertension) Paternal Grandfather     Lung cancer Paternal Grandfather 21   Diabetes type II Paternal Grandfather     Myocardial Infarction (Heart attack) Paternal Grandfather        Review of Systems: Noncontributory   PHYSICAL EXAM: Blood pressure 125/75, pulse 86, weight 73.5 kg (162 lb), last menstrual period 09/19/2022, not currently breastfeeding. CONSTITUTIONAL: Well-developed, well-nourished female in no acute distress.  HENT:  Normocephalic, atraumatic, External right and left ear normal. Oropharynx is clear and moist EYES: Conjunctivae and EOM are normal. Pupils are equal, round, and reactive to light. No scleral icterus.  NECK: Normal range of motion, supple, no masses SKIN: Skin is warm and dry. No rash noted. Not diaphoretic. No erythema. No pallor. [place]: Alert and oriented to person, place, and time. Normal reflexes, muscle tone coordination. No cranial nerve deficit noted. PSYCHIATRIC: Normal mood and affect. Normal behavior. Normal judgment and thought content. CARDIOVASCULAR: Normal heart rate noted, regular rhythm RESPIRATORY: Effort and breath sounds normal, no problems with respiration noted ABDOMEN: Soft, nontender, nondistended. PELVIC: Deferred MUSCULOSKELETAL: Normal range of motion. No edema and no tenderness. 2+ distal pulses.   Labs: Recent Results  No results found for this or any previous visit (from the past 336 hour(s)).     Imaging Studies: No results found.   Assessment: 1. Sterilization consult       Plan: Patient will undergo surgical management with the above noted surgery.   The risks of surgery were discussed in detail with the patient including but not limited to: bleeding which may require transfusion or reoperation; infection which may require antibiotics; injury to surrounding organs which may involve bowel, bladder, ureters ; need for additional procedures including  laparoscopy or laparotomy; thromboembolic phenomenon, surgical site problems and other postoperative/anesthesia complications. Likelihood of success in alleviating the patient's condition was discussed. Routine postoperative instructions will be reviewed with the patient and her family in detail after surgery.  The patient concurred with the proposed plan, giving informed written consent for the surgery.   Preoperative prophylactic antibiotics, as indicated, and SCDs ordered on call to the OR.       Attestation Statement:    I personally performed the service. (TP)   Nice, MD  [place]  10/06/2022 2:33 PM

## 2022-10-14 ENCOUNTER — Inpatient Hospital Stay: Admission: RE | Admit: 2022-10-14 | Payer: BC Managed Care – PPO | Source: Ambulatory Visit

## 2022-10-14 ENCOUNTER — Encounter
Admission: RE | Admit: 2022-10-14 | Discharge: 2022-10-14 | Disposition: A | Discharge status: Home / Self Care | Payer: BC Managed Care – PPO | Source: Ambulatory Visit | Attending: Obstetrics and Gynecology | Admitting: Obstetrics and Gynecology

## 2022-10-14 VITALS — Ht 65.0 in | Wt 160.0 lb

## 2022-10-14 DIAGNOSIS — Z01812 Encounter for preprocedural laboratory examination: Secondary | ICD-10-CM

## 2022-10-14 NOTE — Patient Instructions (Addendum)
Your procedure is scheduled on: Wednesday, March 13 Report to the Registration Desk on the 1st floor of the [facility]. To find out your arrival time, please call [phone] between 1PM - 3PM on: Tuesday, March 12 If your arrival time is 6:00 am, do not arrive before that time as the [place] entrance doors do not open until 6:00 am.  REMEMBER: Instructions that are not followed completely may result in serious medical risk, up to and including death; or upon the discretion of your surgeon and anesthesiologist your surgery may need to be rescheduled.  Do not eat food after midnight the night before surgery.  No gum chewing or hard candies.  You may however, drink CLEAR liquids up to 2 hours before you are scheduled to arrive for your surgery. Do not drink anything within 2 hours of your scheduled arrival time.  Clear liquids include: - water  - apple juice without pulp - gatorade (not RED colors) - black coffee or tea (Do NOT add milk or creamers to the coffee or tea) Do NOT drink anything that is not on this list.  In addition, your doctor has ordered for you to drink the provided:  Ensure Pre-Surgery Clear Carbohydrate Drink  Drinking this carbohydrate drink up to two hours before surgery helps to reduce insulin resistance and improve patient outcomes. Please complete drinking 2 hours before scheduled arrival time.  One week prior to surgery: starting March 6 Stop Anti-inflammatories (NSAIDS) such as Advil, Aleve, Ibuprofen, Motrin, Naproxen, Naprosyn and Aspirin based products such as Excedrin, Goody's Powder, BC Powder. Stop ANY OVER THE COUNTER supplements until after surgery. You may however, continue to take Tylenol if needed for pain up until the day of surgery.  Continue taking all prescribed medications  DO NOT TAKE ANY MEDICATIONS THE MORNING OF SURGERY  No Alcohol for 24 hours before or after surgery.  No Smoking including e-cigarettes for 24 hours before  surgery.  No chewable tobacco products for at least 6 hours before surgery.  No nicotine patches on the day of surgery.  Do not use any "recreational" drugs for at least a week (preferably 2 weeks) before your surgery.  Please be advised that the combination of cocaine and anesthesia may have negative outcomes, up to and including death. If you test positive for cocaine, your surgery will be cancelled.  On the morning of surgery brush your teeth with toothpaste and water, you may rinse your mouth with mouthwash if you wish. Do not swallow any toothpaste or mouthwash.  Use CHG Soap as directed on instruction sheet.  Do not wear jewelry, make-up, hairpins, clips or nail polish.  Do not wear lotions, powders, or perfumes.   Do not shave body hair from the neck down 48 hours before surgery.  Contact lenses, hearing aids and dentures may not be worn into surgery.  Do not bring valuables to the hospital. [facility] is not responsible for any missing/lost belongings or valuables.   Notify your doctor if there is any change in your medical condition (cold, fever, infection).  Wear comfortable clothing (specific to your surgery type) to the hospital.  After surgery, you can help prevent lung complications by doing breathing exercises.  Take deep breaths and cough every 1-2 hours. Your doctor may order a device called an Incentive Spirometer to help you take deep breaths. When coughing or sneezing, hold a pillow firmly against your incision with both hands. This is called "splinting." Doing this helps protect your incision.  It also decreases belly discomfort.  If you are being discharged the day of surgery, you will not be allowed to drive home. You will need a responsible individual to drive you home and stay with you for 24 hours after surgery.   If you are taking public transportation, you will need to have a responsible individual with you.  Please call the [place] [facility].  at [phone] if you have any questions about these instructions.  Surgery Visitation Policy:  Patients undergoing a surgery or procedure may have two family members or support persons with them as long as the person is not COVID-19 positive or experiencing its symptoms.      Preparing for Surgery with CHLORHEXIDINE GLUCONATE (CHG) Soap  Chlorhexidine Gluconate (CHG) Soap  o An antiseptic cleaner that kills germs and bonds with the skin to continue killing germs even after washing  o Used for showering the night before surgery and morning of surgery  Before surgery, you can play an important role by reducing the number of germs on your skin.  CHG (Chlorhexidine gluconate) soap is an antiseptic cleanser which kills germs and bonds with the skin to continue killing germs even after washing.  Please do not use if you have an allergy to CHG or antibacterial soaps. If your skin becomes reddened/irritated stop using the CHG.  1. Shower the NIGHT BEFORE SURGERY and the MORNING OF SURGERY with CHG soap.  2. If you choose to wash your hair, wash your hair first as usual with your normal shampoo.  3. After shampooing, rinse your hair and body thoroughly to remove the shampoo.  4. Use CHG as you would any other liquid soap. You can apply CHG directly to the skin and wash gently with a scrungie or a clean washcloth.  5. Apply the CHG soap to your body only from the neck down. Do not use on open wounds or open sores. Avoid contact with your eyes, ears, mouth, and genitals (private parts). Wash face and genitals (private parts) with your normal soap.  6. Wash thoroughly, paying special attention to the area where your surgery will be performed.  7. Thoroughly rinse your body with warm water.  8. Do not shower/wash with your normal soap after using and rinsing off the CHG soap.  9. Pat yourself dry with a clean towel.  10. Wear clean pajamas to bed the night before surgery.  12. Place  clean sheets on your bed the night of your first shower and do not sleep with pets.  13. Shower again with the CHG soap on the day of surgery prior to arriving at the hospital.  14. Do not apply any deodorants/lotions/powders.  15. Please wear clean clothes to the hospital.

## 2022-10-20 MED ORDER — CHLORHEXIDINE GLUCONATE 0.12 % MT SOLN: 15.0000 mL | Freq: Once | OROMUCOSAL | Status: AC

## 2022-10-20 MED ORDER — FAMOTIDINE 20 MG PO TABS: 20.0000 mg | ORAL_TABLET | Freq: Once | ORAL | Status: AC

## 2022-10-20 MED ORDER — LACTATED RINGERS IV SOLN: INTRAVENOUS | Status: DC

## 2022-10-20 MED ORDER — ORAL CARE MOUTH RINSE: 15.0000 mL | Freq: Once | OROMUCOSAL | Status: AC

## 2022-10-21 ENCOUNTER — Ambulatory Visit
Admission: RE | Admit: 2022-10-21 | Discharge: 2022-10-21 | Disposition: A | Discharge status: Home / Self Care | Payer: BC Managed Care – PPO | Attending: Obstetrics and Gynecology | Admitting: Obstetrics and Gynecology

## 2022-10-21 ENCOUNTER — Ambulatory Visit: Payer: BC Managed Care – PPO | Admitting: Certified Registered"

## 2022-10-21 ENCOUNTER — Other Ambulatory Visit: Payer: Self-pay

## 2022-10-21 ENCOUNTER — Encounter
Admission: RE | Disposition: A | Discharge status: Home / Self Care | Payer: Self-pay | Source: Home / Self Care | Attending: Obstetrics and Gynecology

## 2022-10-21 ENCOUNTER — Encounter: Payer: Self-pay | Admitting: Obstetrics and Gynecology

## 2022-10-21 DIAGNOSIS — Z302 Encounter for sterilization: Secondary | ICD-10-CM | POA: Diagnosis not present

## 2022-10-21 HISTORY — PX: ROBOTIC ASSISTED BILATERAL SALPINGO OOPHERECTOMY: SHX6078

## 2022-10-21 LAB — POCT PREGNANCY, URINE: Preg Test, Ur: NEGATIVE

## 2022-10-21 SURGERY — SALPINGO-OOPHORECTOMY, BILATERAL, ROBOT-ASSISTED
Anesthesia: General | Laterality: Bilateral

## 2022-10-21 MED ORDER — PROPOFOL 10 MG/ML IV BOLUS
INTRAVENOUS | Status: AC
  Filled 2022-10-21: qty 20

## 2022-10-21 MED ORDER — FENTANYL CITRATE (PF) 100 MCG/2ML IJ SOLN
INTRAMUSCULAR | Status: AC
  Filled 2022-10-21: qty 2

## 2022-10-21 MED ORDER — IBUPROFEN 600 MG PO TABS: 600.0000 mg | ORAL_TABLET | Freq: Four times a day (QID) | ORAL | 0 refills | Status: AC

## 2022-10-21 MED ORDER — ONDANSETRON HCL 4 MG/2ML IJ SOLN
INTRAMUSCULAR | Status: AC
  Filled 2022-10-21: qty 2

## 2022-10-21 MED ORDER — MIDAZOLAM HCL 2 MG/2ML IJ SOLN
INTRAMUSCULAR | Status: DC | PRN
  Administered 2022-10-21: 2 mg via INTRAVENOUS

## 2022-10-21 MED ORDER — EPHEDRINE SULFATE (PRESSORS) 50 MG/ML IJ SOLN
INTRAMUSCULAR | Status: DC | PRN
  Administered 2022-10-21: 5 mg via INTRAVENOUS

## 2022-10-21 MED ORDER — MIDAZOLAM HCL 2 MG/2ML IJ SOLN
INTRAMUSCULAR | Status: AC
  Filled 2022-10-21: qty 2

## 2022-10-21 MED ORDER — LIDOCAINE HCL (PF) 2 % IJ SOLN
INTRAMUSCULAR | Status: AC
  Filled 2022-10-21: qty 5

## 2022-10-21 MED ORDER — TRAMADOL HCL 50 MG PO TABS
ORAL_TABLET | ORAL | Status: AC
  Filled 2022-10-21: qty 1

## 2022-10-21 MED ORDER — 0.9 % SODIUM CHLORIDE (POUR BTL) OPTIME
TOPICAL | Status: DC | PRN
  Administered 2022-10-21: 5 mL

## 2022-10-21 MED ORDER — PHENYLEPHRINE HCL (PRESSORS) 10 MG/ML IV SOLN
INTRAVENOUS | Status: DC | PRN
  Administered 2022-10-21 (×3): 80 ug via INTRAVENOUS

## 2022-10-21 MED ORDER — ONDANSETRON HCL 4 MG/2ML IJ SOLN
INTRAMUSCULAR | Status: DC | PRN
  Administered 2022-10-21: 4 mg via INTRAVENOUS

## 2022-10-21 MED ORDER — BUPIVACAINE HCL (PF) 0.5 % IJ SOLN
INTRAMUSCULAR | Status: AC
  Filled 2022-10-21: qty 30

## 2022-10-21 MED ORDER — ONDANSETRON 4 MG PO TBDP: 4.0000 mg | ORAL_TABLET | Freq: Four times a day (QID) | ORAL | 0 refills | Status: AC | PRN

## 2022-10-21 MED ORDER — DEXAMETHASONE SODIUM PHOSPHATE 10 MG/ML IJ SOLN
INTRAMUSCULAR | Status: AC
  Filled 2022-10-21: qty 1

## 2022-10-21 MED ORDER — HYDROMORPHONE HCL 1 MG/ML IJ SOLN: 0.2500 mg | INTRAMUSCULAR | Status: DC | PRN

## 2022-10-21 MED ORDER — KETOROLAC TROMETHAMINE 30 MG/ML IJ SOLN
INTRAMUSCULAR | Status: AC
  Filled 2022-10-21: qty 1

## 2022-10-21 MED ORDER — TRAMADOL HCL 50 MG PO TABS
50.0000 mg | ORAL_TABLET | Freq: Once | ORAL | Status: AC | PRN
  Administered 2022-10-21: 50 mg via ORAL

## 2022-10-21 MED ORDER — HYDROCODONE-ACETAMINOPHEN 5-325 MG PO TABS: 1.0000 | ORAL_TABLET | Freq: Four times a day (QID) | ORAL | 0 refills | Status: AC | PRN

## 2022-10-21 MED ORDER — ACETAMINOPHEN 10 MG/ML IV SOLN
INTRAVENOUS | Status: DC | PRN
  Administered 2022-10-21: 1000 mg via INTRAVENOUS

## 2022-10-21 MED ORDER — ROCURONIUM BROMIDE 10 MG/ML (PF) SYRINGE
PREFILLED_SYRINGE | INTRAVENOUS | Status: AC
  Filled 2022-10-21: qty 10

## 2022-10-21 MED ORDER — DEXAMETHASONE SODIUM PHOSPHATE 10 MG/ML IJ SOLN
INTRAMUSCULAR | Status: DC | PRN
  Administered 2022-10-21: 10 mg via INTRAVENOUS

## 2022-10-21 MED ORDER — LIDOCAINE HCL (CARDIAC) PF 100 MG/5ML IV SOSY
PREFILLED_SYRINGE | INTRAVENOUS | Status: DC | PRN
  Administered 2022-10-21: 100 mg via INTRAVENOUS

## 2022-10-21 MED ORDER — OXYCODONE HCL 5 MG/5ML PO SOLN: 5.0000 mg | Freq: Once | ORAL | Status: DC | PRN

## 2022-10-21 MED ORDER — BUPIVACAINE HCL 0.5 % IJ SOLN
INTRAMUSCULAR | Status: DC | PRN
  Administered 2022-10-21: 10 mL

## 2022-10-21 MED ORDER — PROPOFOL 10 MG/ML IV BOLUS
INTRAVENOUS | Status: DC | PRN
  Administered 2022-10-21: 200 mg via INTRAVENOUS

## 2022-10-21 MED ORDER — FENTANYL CITRATE (PF) 100 MCG/2ML IJ SOLN
INTRAMUSCULAR | Status: DC | PRN
  Administered 2022-10-21: 100 ug via INTRAVENOUS

## 2022-10-21 MED ORDER — FAMOTIDINE 20 MG PO TABS
ORAL_TABLET | ORAL | Status: AC
  Administered 2022-10-21: 20 mg via ORAL
  Filled 2022-10-21: qty 1

## 2022-10-21 MED ORDER — TRAMADOL HCL 50 MG PO TABS: 50.0000 mg | ORAL_TABLET | Freq: Four times a day (QID) | ORAL | Status: DC | PRN

## 2022-10-21 MED ORDER — PHENYLEPHRINE 80 MCG/ML (10ML) SYRINGE FOR IV PUSH (FOR BLOOD PRESSURE SUPPORT)
PREFILLED_SYRINGE | INTRAVENOUS | Status: AC
  Filled 2022-10-21: qty 10

## 2022-10-21 MED ORDER — EPHEDRINE 5 MG/ML INJ
INTRAVENOUS | Status: AC
  Filled 2022-10-21: qty 5

## 2022-10-21 MED ORDER — ACETAMINOPHEN 10 MG/ML IV SOLN
INTRAVENOUS | Status: AC
  Filled 2022-10-21: qty 100

## 2022-10-21 MED ORDER — SUGAMMADEX SODIUM 200 MG/2ML IV SOLN
INTRAVENOUS | Status: DC | PRN
  Administered 2022-10-21: 200 mg via INTRAVENOUS

## 2022-10-21 MED ORDER — ROCURONIUM BROMIDE 100 MG/10ML IV SOLN
INTRAVENOUS | Status: DC | PRN
  Administered 2022-10-21: 50 mg via INTRAVENOUS

## 2022-10-21 MED ORDER — OXYCODONE HCL 5 MG PO TABS: 5.0000 mg | ORAL_TABLET | Freq: Once | ORAL | Status: DC | PRN

## 2022-10-21 MED ORDER — KETOROLAC TROMETHAMINE 30 MG/ML IJ SOLN
INTRAMUSCULAR | Status: DC | PRN
  Administered 2022-10-21: 30 mg via INTRAVENOUS

## 2022-10-21 MED ORDER — CHLORHEXIDINE GLUCONATE 0.12 % MT SOLN
OROMUCOSAL | Status: AC
  Administered 2022-10-21: 15 mL via OROMUCOSAL
  Filled 2022-10-21: qty 15

## 2022-10-21 SURGICAL SUPPLY — 59 items
ADH SKN CLS APL DERMABOND .7 (GAUZE/BANDAGES/DRESSINGS) ×1
BAG DRN RND TRDRP ANRFLXCHMBR (UROLOGICAL SUPPLIES) ×1
BAG URINE DRAIN 2000ML AR STRL (UROLOGICAL SUPPLIES) ×1 IMPLANT
CATH FOLEY 2WAY  5CC 16FR (CATHETERS) ×1
CATH FOLEY 2WAY 5CC 16FR (CATHETERS) ×1
CATH URTH 16FR FL 2W BLN LF (CATHETERS) ×1 IMPLANT
COVER TIP SHEARS 8 DVNC (MISCELLANEOUS) IMPLANT
COVER TIP SHEARS 8MM DA VINCI (MISCELLANEOUS)
COVER WAND RF STERILE (DRAPES) ×1 IMPLANT
DERMABOND ADVANCED .7 DNX12 (GAUZE/BANDAGES/DRESSINGS) ×1 IMPLANT
DRAPE 3/4 80X56 (DRAPES) ×1 IMPLANT
DRAPE ARM DVNC X/XI (DISPOSABLE) ×3 IMPLANT
DRAPE COLUMN DVNC XI (DISPOSABLE) ×1 IMPLANT
DRAPE DA VINCI XI ARM (DISPOSABLE) ×3
DRAPE DA VINCI XI COLUMN (DISPOSABLE) ×1
DRAPE ROBOT W/ LEGGING 30X125 (DRAPES) ×1 IMPLANT
DRAPE UNDER BUTTOCK W/FLU (DRAPES) ×1 IMPLANT
ELECT REM PT RETURN 9FT ADLT (ELECTROSURGICAL) ×1
ELECTRODE REM PT RTRN 9FT ADLT (ELECTROSURGICAL) ×1 IMPLANT
GLOVE BIO SURGEON STRL SZ7 (GLOVE) ×2 IMPLANT
GLOVE SURG UNDER POLY LF SZ7.5 (GLOVE) ×3 IMPLANT
GOWN STRL REUS W/ TWL LRG LVL3 (GOWN DISPOSABLE) ×3 IMPLANT
GOWN STRL REUS W/TWL LRG LVL3 (GOWN DISPOSABLE) ×3
GRASPER SUT TROCAR 14GX15 (MISCELLANEOUS) IMPLANT
IRRIGATION STRYKERFLOW (MISCELLANEOUS) IMPLANT
IRRIGATOR STRYKERFLOW (MISCELLANEOUS)
IV NS 1000ML (IV SOLUTION) ×1
IV NS 1000ML BAXH (IV SOLUTION) ×1 IMPLANT
KIT PINK PAD W/HEAD ARE REST (MISCELLANEOUS) ×1
KIT PINK PAD W/HEAD ARM REST (MISCELLANEOUS) ×1 IMPLANT
LABEL OR SOLS (LABEL) ×1 IMPLANT
MANIFOLD NEPTUNE II (INSTRUMENTS) ×1 IMPLANT
NS IRRIG 1000ML POUR BTL (IV SOLUTION) ×1 IMPLANT
OBTURATOR OPTICAL STANDARD 8MM (TROCAR) ×1
OBTURATOR OPTICAL STND 8 DVNC (TROCAR) ×1
OBTURATOR OPTICALSTD 8 DVNC (TROCAR) ×1 IMPLANT
PACK LAP CHOLECYSTECTOMY (MISCELLANEOUS) ×1 IMPLANT
PAD OB MATERNITY 4.3X12.25 (PERSONAL CARE ITEMS) ×1 IMPLANT
PAD PREP 24X41 OB/GYN DISP (PERSONAL CARE ITEMS) ×1 IMPLANT
SCRUB CHG 4% DYNA-HEX 4OZ (MISCELLANEOUS) ×1 IMPLANT
SEAL CANN UNIV 5-8 DVNC XI (MISCELLANEOUS) ×3 IMPLANT
SEAL XI 5MM-8MM UNIVERSAL (MISCELLANEOUS) ×3
SEALER VESSEL DA VINCI XI (MISCELLANEOUS) ×2
SEALER VESSEL EXT DVNC XI (MISCELLANEOUS) IMPLANT
SOL ELECTROSURG ANTI STICK (MISCELLANEOUS) ×1
SOLUTION ELECTROSURG ANTI STCK (MISCELLANEOUS) ×1 IMPLANT
SPONGE T-LAP 18X18 ~~LOC~~+RFID (SPONGE) IMPLANT
SURGILUBE 2OZ TUBE FLIPTOP (MISCELLANEOUS) ×1 IMPLANT
SUT MNCRL 4-0 (SUTURE) ×1
SUT MNCRL 4-0 27XMFL (SUTURE) ×1
SUT VICRYL 0 UR6 27IN ABS (SUTURE) IMPLANT
SUT VLOC 90 S/L VL9 GS22 (SUTURE) IMPLANT
SUTURE MNCRL 4-0 27XMF (SUTURE) ×1 IMPLANT
SYR 10ML LL (SYRINGE) ×1 IMPLANT
TAPE TRANSPORE STRL 2 31045 (GAUZE/BANDAGES/DRESSINGS) IMPLANT
TOWEL OR 17X26 4PK STRL BLUE (TOWEL DISPOSABLE) ×1 IMPLANT
TRAP FLUID SMOKE EVACUATOR (MISCELLANEOUS) ×1 IMPLANT
TUBING EVAC SMOKE HEATED PNEUM (TUBING) ×1 IMPLANT
WATER STERILE IRR 500ML POUR (IV SOLUTION) ×1 IMPLANT

## 2022-10-21 NOTE — Anesthesia Preprocedure Evaluation (Signed)
Anesthesia Evaluation  Patient identified by MRN, date of birth, ID band Patient awake    Reviewed: Allergy & Precautions, NPO status , Patient's Chart, lab work & pertinent test results  History of Anesthesia Complications Negative for: history of anesthetic complications  Airway Mallampati: III  TM Distance: >3 FB Neck ROM: full    Dental no notable dental hx.    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 No date: Gestational diabetes  Past Surgical History: 08/02/2021: CESAREAN SECTION     Comment:  Procedure: CESAREAN SECTION;  Surgeon: Gae Dry,              MD;  Location: [facility] ORS;  Service: Obstetrics;; 2018: COLPOSCOPY  BMI    Body Mass Index: 25.79 kg/m      Reproductive/Obstetrics negative OB ROS                             Anesthesia Physical Anesthesia Plan  ASA: 2  Anesthesia Plan: General ETT   Post-op Pain Management: Ofirmev IV (intra-op)*, Toradol IV (intra-op)* and Dilaudid IV   Induction: Intravenous  PONV Risk Score and Plan: Ondansetron, Dexamethasone, Midazolam and Treatment may vary due to age or medical condition  Airway Management Planned: Oral ETT  Additional Equipment:   Intra-op Plan:   Post-operative Plan: Extubation in OR  Informed Consent: I have reviewed the patients History and Physical, chart, labs and discussed the procedure including the risks, benefits and  alternatives for the proposed anesthesia with the patient or authorized representative who has indicated his/her understanding and acceptance.     Dental Advisory Given  Plan Discussed with: Anesthesiologist, CRNA and Surgeon  Anesthesia Plan Comments: (Patient consented for risks of anesthesia including but not limited to:  - adverse reactions to medications - damage to eyes, teeth, lips or other oral mucosa - nerve damage due to positioning  - sore throat or hoarseness - Damage to heart, brain, nerves, lungs, other parts of body or loss of life  Patient voiced understanding.)       Anesthesia Quick Evaluation

## 2022-10-21 NOTE — Anesthesia Postprocedure Evaluation (Signed)
Anesthesia Post Note  Patient: Jennifer Pearson  Procedure(s) Performed: XI ROBOTIC ASSISTED BILATERAL SALPINGECTOMY (Bilateral)  Patient location during evaluation: PACU Anesthesia Type: General Level of consciousness: awake and alert Pain management: pain level controlled Vital Signs Assessment: post-procedure vital signs reviewed and stable Respiratory status: spontaneous breathing, nonlabored ventilation, respiratory function stable and patient connected to nasal cannula oxygen Cardiovascular status: blood pressure returned to baseline and stable Postop Assessment: no apparent nausea or vomiting Anesthetic complications: no   No notable events documented.   Last Vitals:  Vitals:   10/21/22 0957 10/21/22 1216  BP: 135/82 (!) 107/51  Pulse: 84 97  Resp: 16 12  Temp: 36.8 C (!) 36.4 C  SpO2: 98% 100%    Last Pain:  Vitals:   10/21/22 1216  TempSrc:   PainSc: 0-No pain                 Ilene Qua

## 2022-10-21 NOTE — Op Note (Signed)
Operative Note    Name: Jennifer Pearson  Date of Service: 10/21/2022  DOB: 07-28-1986  MRN: [id number]   Pre-Operative Diagnosis: Desires permanent sterilization  Post-Operative Diagnosis: Desires permanent sterilization  Procedures:  Robot assisted laparoscopic bilateral salpingectomy  Primary Surgeon: Prentice Docker, MD   EBL: minimal   IVF: 600 mL   Urine output: 350 mL  Specimens: Right and left fallopian tubes  Drains: none  Complications: None   Disposition: PACU   Condition: Stable   Findings:  1) Omentum adherent to anterior abdominal wall at level of cesarean section scar 2) Normal appearing uterus, bilateral fallopian tubes, and ovaries  Procedure Summary:  The patient was taken to the operating room where general anesthesia was administered and found to be adequate. She was placed in the dorsal supine lithotomy position in Baden stirrups and prepped and draped in usual sterile fashion. After a timeout was called an indwelling catheter was placed in her bladder.  A sponge on a stick was placed in her vagina for uterine manipulation.  Attention was turned to the abdomen where after injection of local anesthetic, an 8 mm infraumbilical incision was made with the scalpel. Entry into the abdomen was obtained via Optiview trocar technique (a blunt entry technique with camera visualization through the obturator upon entry). Verification of entry into the abdomen was obtained using opening pressures. The abdomen was insufflated with CO2. The camera was introduced through the trocar with verification of atraumatic entry.  Right and left abdominal entry sites were created after injection of local anesthetic about 8 cm away from the umbilical port in accordance with the Intuitive manufacturer's recommendations.  The port sites were 8 mm.  The intuitive trochars were introduced under intra-abdominal camera visualization without difficulty.  The XI robot was docked on the  patient's left.  Clearance was verified from the patient's legs.  Through the umbilical port the camera was placed.  Through the port attached to arm 4 the vessel sealer was placed.  Through the port attached to arm 2 the forced bipolar forceps were was placed.    Attention was turned to the adhesion of the omentum to the prior cesarean section location.  This was first inspected and found to be free of bowel.  The vessel sealer was used to cauterize and then transect the omentum until this was removed from the field of vision.  The uterus and fallopian tubes could not be visualized without difficulty.  The right fallopian tube was elevated at the fimbriated end.  Using the vessel sealer the mesosalpinx was cauterized and transected in a lateral to medial fashion.  Once at the level of the right cornu the fallopian tube was cauterized and transected at this region.  The specimen was placed in the anterior cul-de-sac.  The dissection line was visualized and found to be hemostatic.  The same procedure was carried out on the left fallopian tube without difficulty.  Hemostasis was noted along the dissection line.  Each specimen was handed through the fourth port and removed from the body cavity without difficulty.  The intra-abdominal pressure was lowered to 5 mmHg and ongoing hemostasis was verified.  All instruments were removed.  The robot was undocked from the patient.  The abdomen was emptied of CO2 after returning the patient to level position.  This was done with the aid of 5 deep breaths from anesthesia.  All trocars were removed.  The skin was closed using 4-0 Monocryl in a subcuticular fashion and reinforced using  surgical skin glue.  Attention was turned to the vagina.  The sponge on a stick was removed from the vagina.  A digital sweep of the vagina showed that no instruments or sponges remained.  The Foley catheter was removed.  The patient tolerated the procedure well.  Sponge, lap, needle, and  instrument counts were correct x 2.  VTE prophylaxis: SCDs. Antibiotic prophylaxis: none indicated and none given. She was awakened in the operating room and was taken to the PACU in stable condition.   Prentice Docker, MD 10/21/2022 12:03 PM

## 2022-10-21 NOTE — Discharge Instructions (Signed)

## 2022-10-21 NOTE — Transfer of Care (Signed)
Immediate Anesthesia Transfer of Care Note  Patient: Jennifer Pearson  Procedure(s) Performed: XI ROBOTIC ASSISTED BILATERAL SALPINGECTOMY (Bilateral)  Patient Location: PACU  Anesthesia Type:General  Level of Consciousness: awake, alert , and oriented  Airway & Oxygen Therapy: Patient Spontanous Breathing and Patient connected to face mask oxygen  Post-op Assessment: Report given to RN and Post -op Vital signs reviewed and stable  Post vital signs: Reviewed and stable  Last Vitals:  Vitals Value Taken Time  BP 107/51 10/21/22 1215  Temp 35.8 1215  Pulse 94 10/21/22 1219  Resp 14 10/21/22 1219  SpO2 100 % 10/21/22 1219  Vitals shown include unvalidated device data.  Last Pain:  Vitals:   10/21/22 0957  TempSrc: Tympanic  PainSc: 0-No pain         Complications: No notable events documented.

## 2022-10-21 NOTE — Anesthesia Procedure Notes (Signed)
Procedure Name: Intubation Date/Time: 10/21/2022 11:05 AM  Performed by: Cammie Sickle, CRNAPre-anesthesia Checklist: Patient identified, Patient being monitored, Timeout performed, Emergency Drugs available and Suction available Patient Re-evaluated:Patient Re-evaluated prior to induction Oxygen Delivery Method: Circle system utilized Preoxygenation: Pre-oxygenation with 100% oxygen Induction Type: IV induction Ventilation: Mask ventilation without difficulty Laryngoscope Size: 3 and McGraph Grade View: Grade I Tube type: Oral Tube size: 7.0 mm Number of attempts: 1 Airway Equipment and Method: Stylet Placement Confirmation: ETT inserted through vocal cords under direct vision, positive ETCO2 and breath sounds checked- equal and bilateral Secured at: 22 cm Tube secured with: Tape Dental Injury: Teeth and Oropharynx as per pre-operative assessment  Comments: Atraumatic smooth intubation, no complications noted

## 2022-10-21 NOTE — Interval H&P Note (Signed)
History and Physical Interval Note:  10/21/2022 10:45 AM  Jennifer Pearson  has presented today for surgery, with the diagnosis of sterilization.  The various methods of treatment have been discussed with the patient and family. After consideration of risks, benefits and other options for treatment, the patient has consented to  Procedure(s): XI ROBOTIC ASSISTED BILATERAL SALPINGECTOMY (Bilateral) as a surgical intervention.  The patient's history has been reviewed, patient examined, no change in status, stable for surgery.  I have reviewed the patient's chart and labs.  Questions were answered to the patient's satisfaction.  Consents reviewed. Patient is 100% sure she desires no future pregnancies and she would like to proceed.   Prentice Docker, MD, [facility] 10/21/2022 10:46 AM

## 2022-10-22 ENCOUNTER — Encounter: Payer: Self-pay | Admitting: Obstetrics and Gynecology

## 2022-10-22 LAB — SURGICAL PATHOLOGY

## 2022-12-03 IMAGING — DX DG RIBS W/ CHEST 3+V*R*
3 series · 3 of 3 positions shown · non-contrast
Comparison: None.

CLINICAL DATA: Right-sided rib pain since coughing Ziko 3 weeks
ago.

EXAM:
RIGHT RIBS AND CHEST - 3+ VIEW

[chest pa]
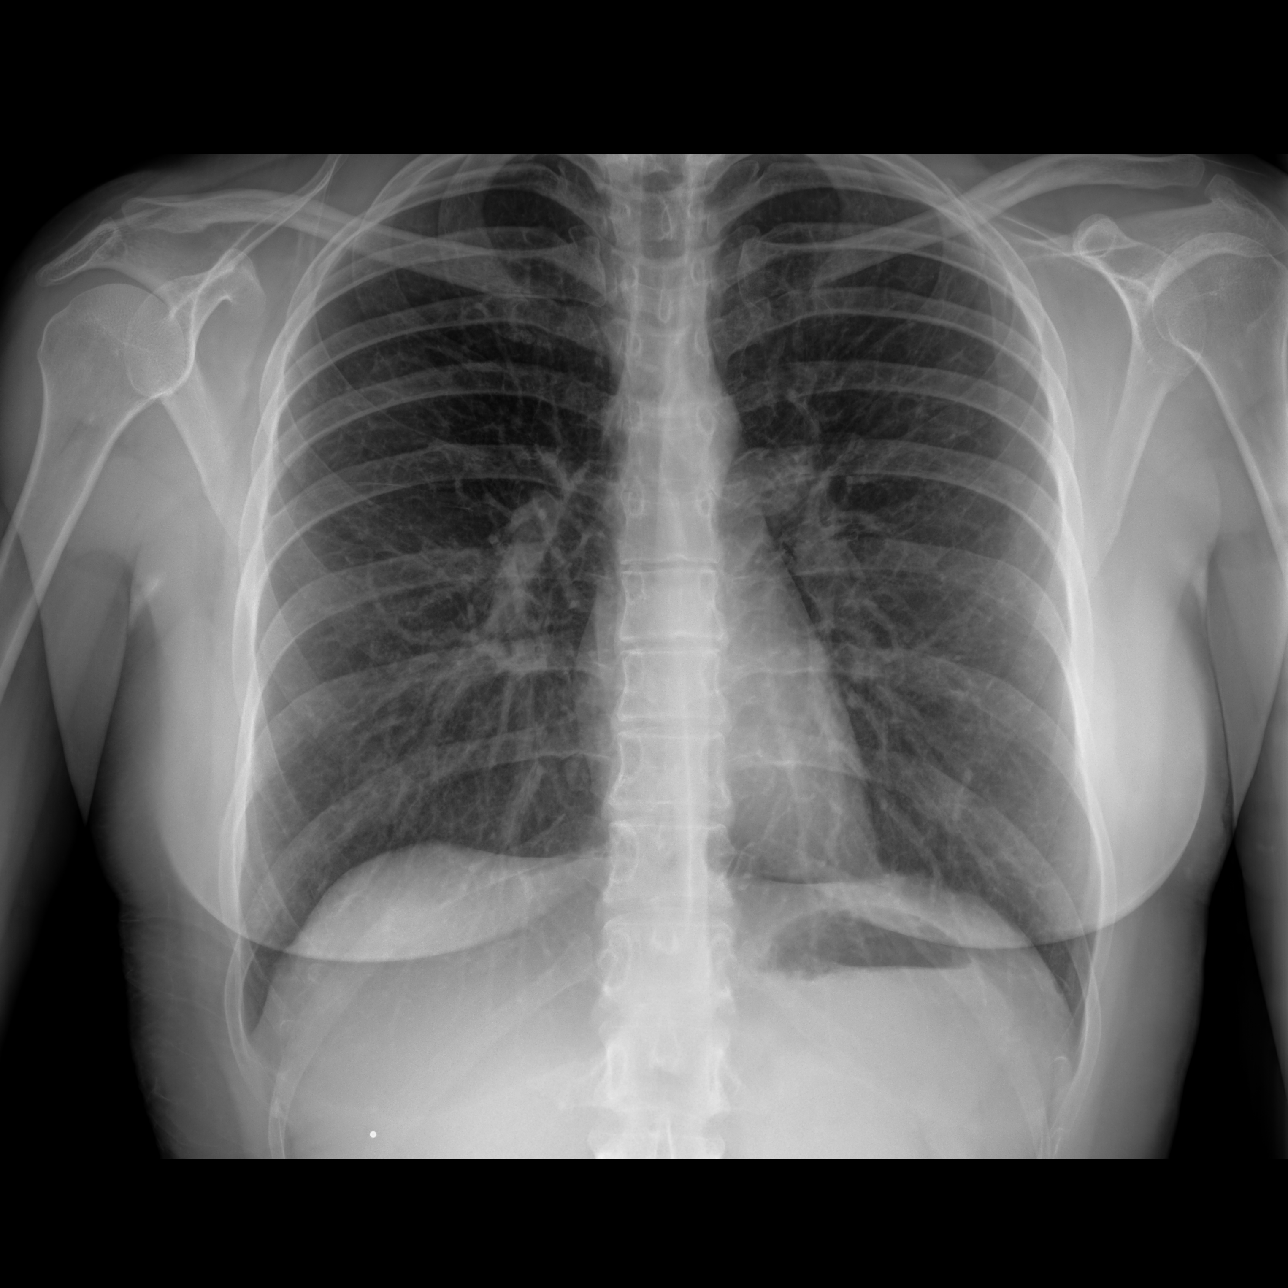

[hemithorax (ribs) ap]
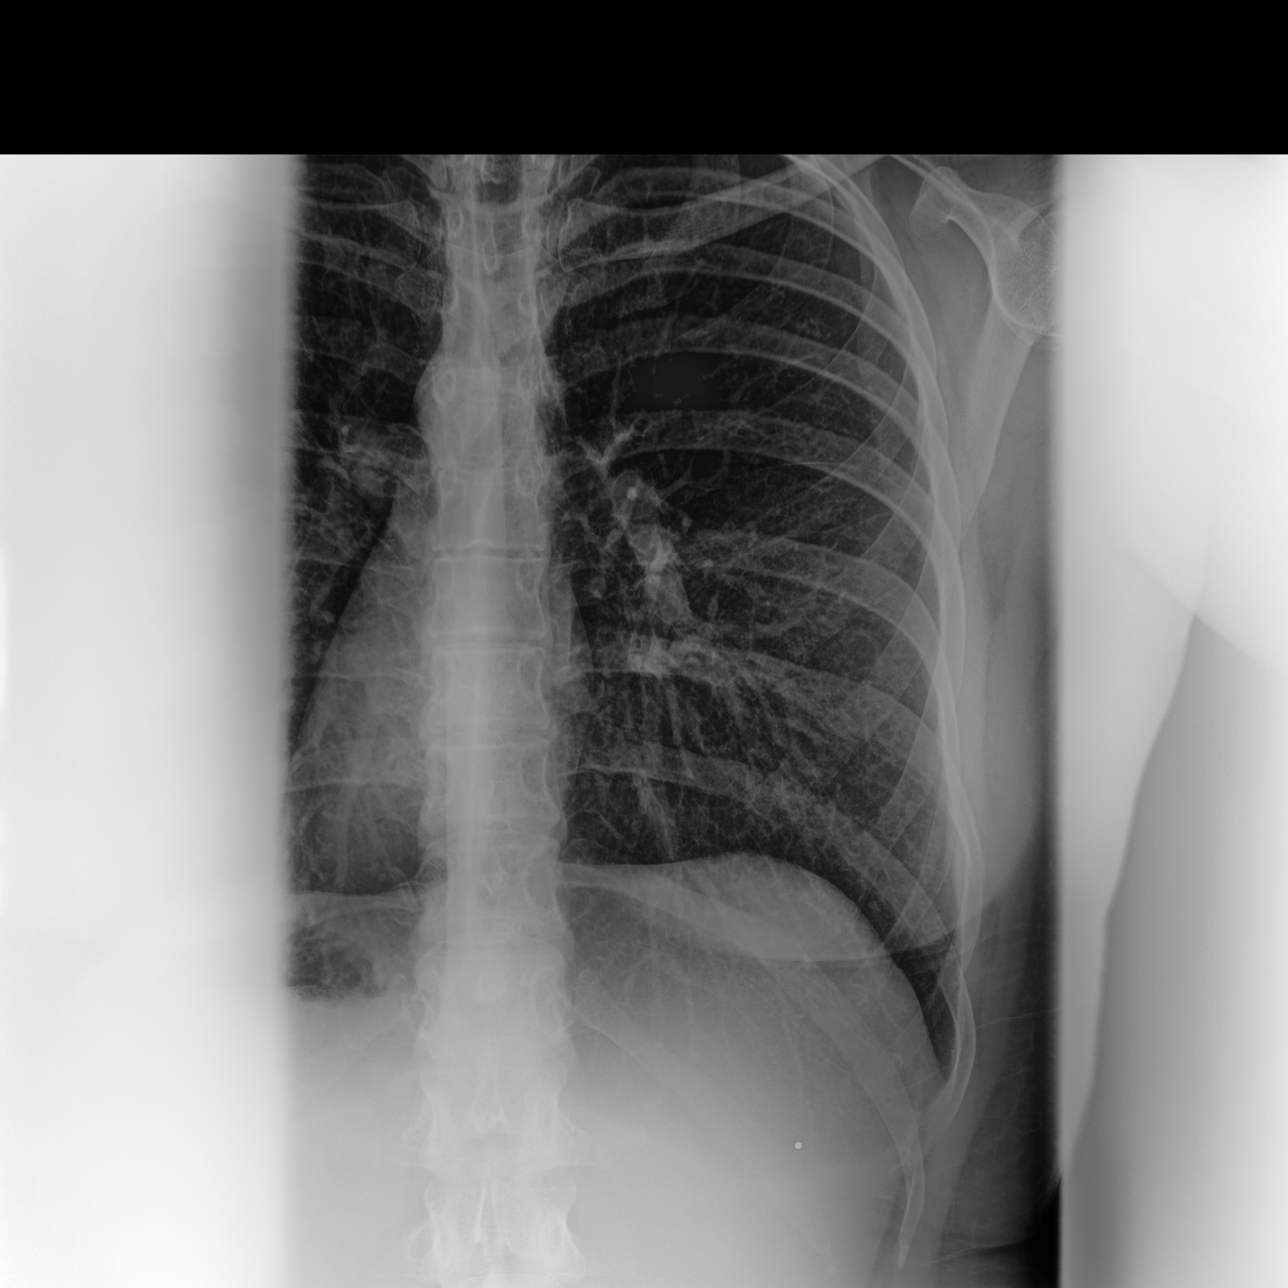

[hemithorax (ribs) mlo]
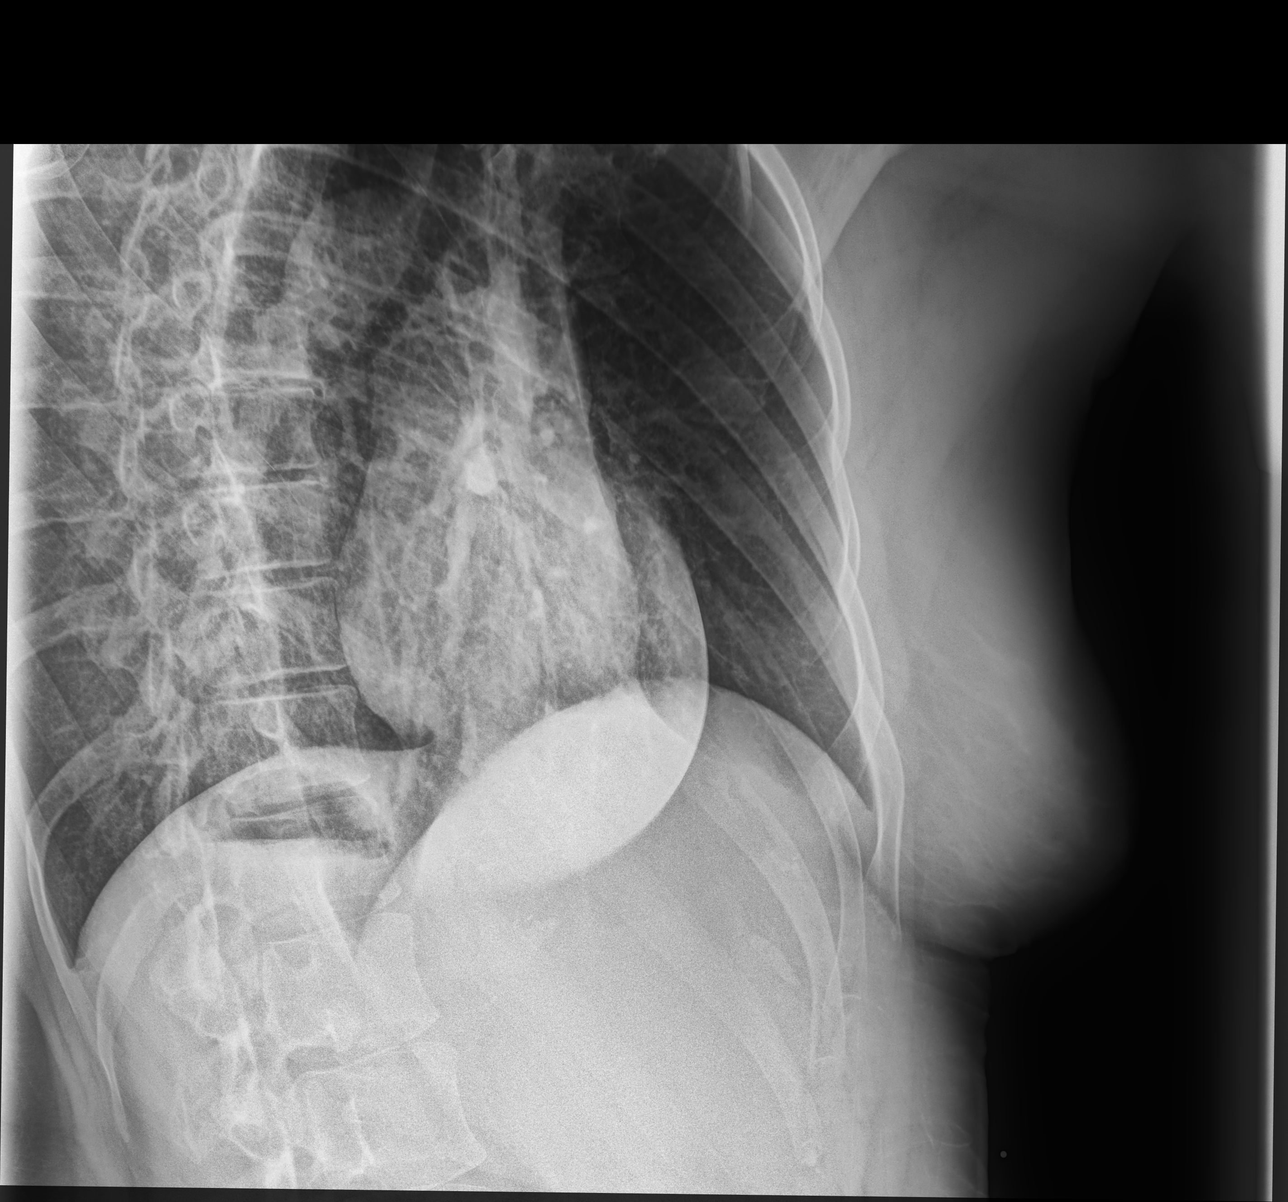

[3 of 3 positions shown; findings below may reference images not displayed]

FINDINGS: Normal cardiac silhouette and mediastinal contours. No focal
parenchymal opacities. No pleural effusion or pneumothorax. No
evidence of edema.

No definite displaced right-sided rib fractures with special
attention paid to the area demarcated by the radiopaque BB. Regional
soft tissues appear normal.
IMPRESSION: 1. No acute cardiopulmonary disease.
2. No definite displaced right-sided rib fractures special attention
paid to the area demarcated by the radiopaque BB.

## 2023-03-28 ENCOUNTER — Ambulatory Visit
Admission: EM | Admit: 2023-03-28 | Discharge: 2023-03-28 | Disposition: A | Discharge status: Home / Self Care | Payer: BC Managed Care – PPO | Attending: Emergency Medicine | Admitting: Emergency Medicine

## 2023-03-28 DIAGNOSIS — R197 Diarrhea, unspecified: Secondary | ICD-10-CM | POA: Diagnosis present

## 2023-03-28 DIAGNOSIS — B9689 Other specified bacterial agents as the cause of diseases classified elsewhere: Secondary | ICD-10-CM | POA: Diagnosis present

## 2023-03-28 DIAGNOSIS — N760 Acute vaginitis: Secondary | ICD-10-CM | POA: Insufficient documentation

## 2023-03-28 DIAGNOSIS — Z1152 Encounter for screening for COVID-19: Secondary | ICD-10-CM | POA: Diagnosis not present

## 2023-03-28 LAB — WET PREP, GENITAL
Sperm: NONE SEEN
Trich, Wet Prep: NONE SEEN
WBC, Wet Prep HPF POC: >10 — AB (ref <10)
Yeast Wet Prep HPF POC: NONE SEEN

## 2023-03-28 LAB — URINALYSIS, W/ REFLEX TO CULTURE (INFECTION SUSPECTED)
Bilirubin Urine: NEGATIVE
Glucose, UA: NEGATIVE mg/dL
Ketones, ur: NEGATIVE mg/dL
Leukocytes,Ua: NEGATIVE
Nitrite: NEGATIVE
Protein, ur: NEGATIVE mg/dL
Specific Gravity, Urine: 1.02 (ref 1.005–1.030)
pH: 6 (ref 5.0–8.0)

## 2023-03-28 LAB — SARS CORONAVIRUS 2 BY RT PCR: SARS Coronavirus 2 by RT PCR: NEGATIVE

## 2023-03-28 MED ORDER — METRONIDAZOLE 500 MG PO TABS: 500.0000 mg | ORAL_TABLET | Freq: Two times a day (BID) | ORAL | 0 refills | Status: AC

## 2023-03-28 NOTE — ED Provider Notes (Signed)
[facility]    CSN: [id number] Arrival date & time: 03/28/23  0803      History   Chief Complaint Chief Complaint  Patient presents with   Back Pain    HPI Jennifer Pearson is a 37 y.o. female.   37 year old female, Jennifer Pearson, presents to urgent care for evaluation of dysuria, "raw feeling of stomach, no discharge.   The history is provided by the patient. No language interpreter was used.    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   BRCA neg   COVID 10/2019   Eosinophilic asthma    Gestational diabetes     Patient Active Problem List   Diagnosis Date Noted   BV (bacterial vaginosis) 03/28/2023   Diarrhea 03/28/2023   Admission for sterilization 10/21/2022   Encounter for care or examination of lactating mother 08/04/2021   Postpartum care following cesarean delivery 08/04/2021   Status post primary low transverse cesarean section 08/04/2021    Past Surgical History:  Procedure Laterality Date   CESAREAN SECTION  08/02/2021   Procedure: CESAREAN SECTION;  Surgeon: Nadara Mustard, MD;  Location: [facility];  Service: Obstetrics;;   COLPOSCOPY  2018   ROBOTIC ASSISTED BILATERAL SALPINGO OOPHERECTOMY Bilateral 10/21/2022   Procedure: XI ROBOTIC ASSISTED BILATERAL SALPINGECTOMY;  Surgeon: Conard Novak, MD;  Location: [facility];  Service: Gynecology;  Laterality: Bilateral;    OB History     Gravida  2   Para  1   Term  1   Preterm  0   AB  1   Living  1      SAB  0   IAB  1   Ectopic  0   Multiple  0   Live Births  1            Home Medications    Prior to Admission medications   Medication Sig Start Date End Date Taking? Authorizing Provider  Collagen Hydrolysate POWD by Does not apply route.   Yes [provider]  EPINEPHrine 0.3 mg/0.3 mL IJ SOAJ injection Inject 0.3 mg into the muscle as needed for anaphylaxis.   Yes [provider]  metroNIDAZOLE (FLAGYL) 500 MG tablet Take  1 tablet (500 mg total) by mouth 2 (two) times daily. 03/28/23  Yes Toron Bowring, Para March, NP  omalizumab Geoffry Paradise) 150 MG/ML prefilled syringe Inject 150 mg into the skin every 14 (fourteen) days.   Yes [provider]  sertraline (ZOLOFT) 100 MG tablet Take 100 mg by mouth at bedtime.   Yes [provider]  Geoffry Paradise 150 MG/ML prefilled syringe Inject 150 mg into the skin every 14 (fourteen) days. 01/01/21  Yes [provider]  Geoffry Paradise 75 MG/0.5ML prefilled syringe Inject 75 mg into the skin every 14 (fourteen) days. 11/27/20  Yes [provider]  HYDROcodone-acetaminophen (NORCO/VICODIN) 5-325 MG tablet Take 1 tablet by mouth every 6 (six) hours as needed (breakthrough pain). 10/21/22   Conard Novak, MD  ibuprofen (ADVIL) 600 MG tablet Take 1 tablet (600 mg total) by mouth every 6 (six) hours. 10/21/22   Conard Novak, MD  ondansetron (ZOFRAN-ODT) 4 MG disintegrating tablet Take 1 tablet (4 mg total) by mouth every 6 (six) hours as needed for nausea. 10/21/22   Conard Novak, MD    Family History Family History  Problem Relation Age of Onset   Heart Problems Mother    Hypertension Father    Breast cancer Maternal Grandmother  60   Hypertension Maternal Grandmother    Hypertension Maternal Grandfather    Breast cancer Paternal Grandmother 64   Hypertension Paternal Grandmother    Diabetes Paternal Grandfather    Lung cancer Paternal Grandfather 61   Hypertension Paternal Grandfather     Social History Social History   Tobacco Use   Smoking status: Never   Smokeless tobacco: Never  Vaping Use   Vaping status: Never Used  Substance Use Topics   Alcohol use: Yes    Alcohol/week: 2.0 standard drinks of alcohol    Types: 1 Glasses of wine, 1 Cans of beer per week    Comment: occassional   Drug use: No     Allergies   Patient has no known allergies.   Review of Systems Review of Systems  Constitutional:  Negative for fever.   Gastrointestinal:  Positive for abdominal pain and diarrhea. Negative for nausea and vomiting.  Genitourinary:  Positive for dysuria.  Musculoskeletal:  Positive for back pain.  All other systems reviewed and are negative.    Physical Exam Triage Vital Signs ED Triage Vitals  Encounter Vitals Group     BP      Systolic BP Percentile      Diastolic BP Percentile      Pulse      Resp      Temp      Temp src      SpO2      Weight      Height      Head Circumference      Peak Flow      Pain Score      Pain Loc      Pain Education      Exclude from Growth Chart    No data found.  Updated Vital Signs BP 111/72 (BP Location: Left Arm)   Pulse 71   Temp 98.5 F (36.9 C) (Oral)   Ht 5\' 5"  (1.651 m)   Wt 158 lb (71.7 kg)   LMP 03/11/2023   SpO2 100%   BMI 26.29 kg/m   Visual Acuity Right Eye Distance:   Left Eye Distance:   Bilateral Distance:    Right Eye Near:   Left Eye Near:    Bilateral Near:     Physical Exam Vitals and nursing note reviewed.  Constitutional:      General: She is not in acute distress.    Appearance: She is well-developed and well-groomed.  HENT:     Head: Normocephalic and atraumatic.  Eyes:     Conjunctiva/sclera: Conjunctivae normal.  Cardiovascular:     Rate and Rhythm: Normal rate and regular rhythm.     Pulses: Normal pulses.     Heart sounds: Normal heart sounds. No murmur heard. Pulmonary:     Effort: Pulmonary effort is normal. No respiratory distress.     Breath sounds: Normal breath sounds.  Abdominal:     General: Bowel sounds are normal.     Palpations: Abdomen is soft.     Tenderness: There is abdominal tenderness in the epigastric area. There is no guarding or rebound.  Musculoskeletal:        General: No swelling.     Cervical back: Neck supple.     Thoracic back: Tenderness present. No swelling, edema, deformity, signs of trauma, lacerations, spasms or bony tenderness. Normal range of motion. No scoliosis.   Skin:    General: Skin is warm and dry.     Capillary Refill: Capillary  refill takes less than 2 seconds.  Neurological:     General: No focal deficit present.     Mental Status: She is alert and oriented to person, place, and time.     GCS: GCS eye subscore is 4. GCS verbal subscore is 5. GCS motor subscore is 6.  Psychiatric:        Attention and Perception: Attention normal.        Mood and Affect: Mood normal.        Speech: Speech normal.        Behavior: Behavior normal. Behavior is cooperative.      UC Treatments / Results  Labs (all labs ordered are listed, but only abnormal results are displayed) Labs Reviewed  WET PREP, GENITAL - Abnormal; Notable for the following components:      Result Value   Clue Cells Wet Prep HPF POC PRESENT (*)    WBC, Wet Prep HPF POC >10 (*)    All other components within normal limits  URINALYSIS, W/ REFLEX TO CULTURE (INFECTION SUSPECTED) - Abnormal; Notable for the following components:   Hgb urine dipstick TRACE (*)    Bacteria, UA FEW (*)    All other components within normal limits  SARS CORONAVIRUS 2 BY RT PCR    EKG   Radiology No results found.  Procedures Procedures (including critical care time)  Medications Ordered in UC Medications - No data to display  Initial Impression / Assessment and Plan / UC Course  I have reviewed the triage vital signs and the nursing notes.  Pertinent labs & imaging results that were available during my care of the patient were reviewed by me and considered in my medical decision making (see chart for details).     Ddx: BV, GERD, viral illness, UTI, back pain,muscle aches Final Clinical Impressions(s) / UC Diagnoses   Final diagnoses:  BV (bacterial vaginosis)  Diarrhea, unspecified type     Discharge Instructions      Your urine was negative for UTI, your swab showed BV, take flagyl as directed, avoid alcohol while taking this medication.  Follow-up with your PCP, information  regarding diet choices with diarrhea.  If your symptoms persist follow-up with your PCP.     ED Prescriptions     Medication Sig Dispense Auth. Provider   metroNIDAZOLE (FLAGYL) 500 MG tablet Take 1 tablet (500 mg total) by mouth 2 (two) times daily. 14 tablet Jdyn Parkerson, Para March, NP      PDMP not reviewed this encounter.   Clancy Gourd, NP 03/28/23 1127

## 2023-03-28 NOTE — Discharge Instructions (Addendum)
Your urine was negative for UTI, your swab showed BV, take flagyl as directed, avoid alcohol while taking this medication.  Follow-up with your PCP, information regarding diet choices with diarrhea.  If your symptoms persist follow-up with your PCP.

## 2023-03-28 NOTE — ED Triage Notes (Signed)
Pt c/o Lower back pain, "raw" feeling in lower stomach, Diarrhea x4days  Pt denies any vaginal odor or discharge.   Pt states that she has not been able to pee as much but has been drinking a lot of water.   Pt used an Enema 2 weeks ago.

## 2023-11-26 ENCOUNTER — Encounter: Payer: Self-pay | Admitting: Emergency Medicine

## 2023-11-26 ENCOUNTER — Ambulatory Visit
Admission: EM | Admit: 2023-11-26 | Discharge: 2023-11-26 | Disposition: A | Discharge status: Home / Self Care | Attending: Emergency Medicine | Admitting: Emergency Medicine

## 2023-11-26 DIAGNOSIS — R300 Dysuria: Secondary | ICD-10-CM | POA: Insufficient documentation

## 2023-11-26 DIAGNOSIS — R197 Diarrhea, unspecified: Secondary | ICD-10-CM | POA: Insufficient documentation

## 2023-11-26 DIAGNOSIS — R109 Unspecified abdominal pain: Secondary | ICD-10-CM | POA: Insufficient documentation

## 2023-11-26 LAB — URINALYSIS, W/ REFLEX TO CULTURE (INFECTION SUSPECTED)
Bilirubin Urine: NEGATIVE
Glucose, UA: NEGATIVE mg/dL
Leukocytes,Ua: NEGATIVE
Nitrite: NEGATIVE
Protein, ur: NEGATIVE mg/dL
Specific Gravity, Urine: 1.025 (ref 1.005–1.030)
pH: 5.5 (ref 5.0–8.0)

## 2023-11-26 IMAGING — US US MFM OB DETAIL+14 WK
1 series · 13 of 28 positions shown · non-contrast
Comparison: none

[Series 1: us mfm ob detail+14 wk · 54 acquisitions, 13 frames shown]
[im 2/54]
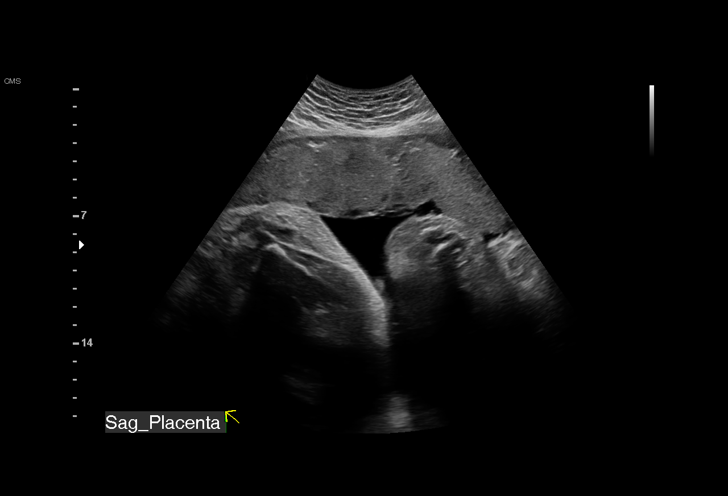
[im 6/54]
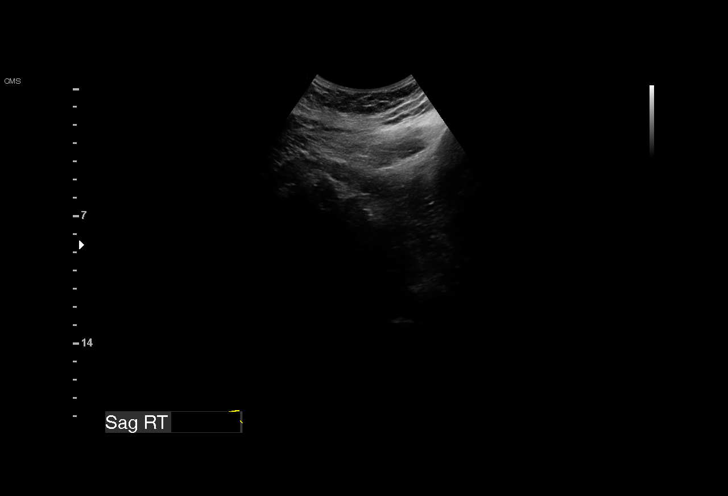
[im 10/54]
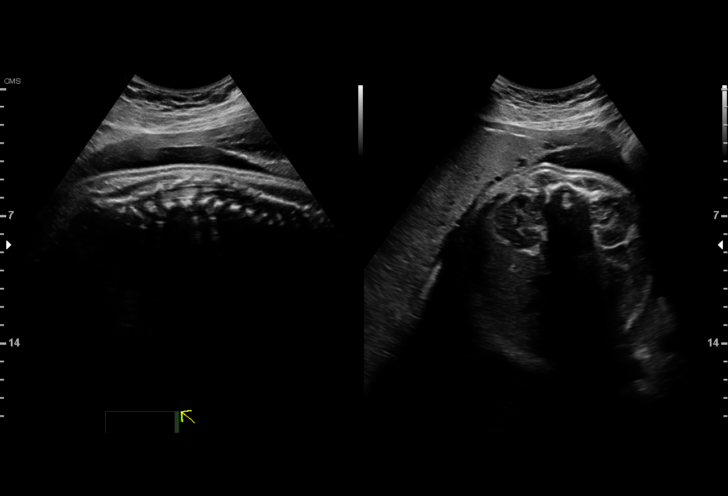
[im 14/54]
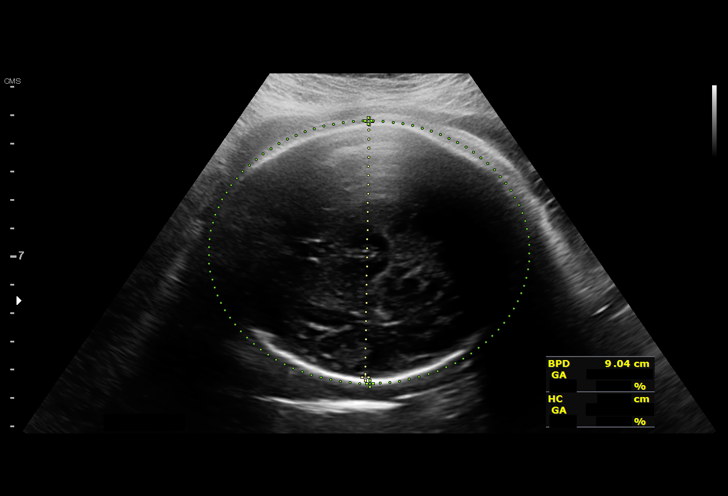
[im 18/54]
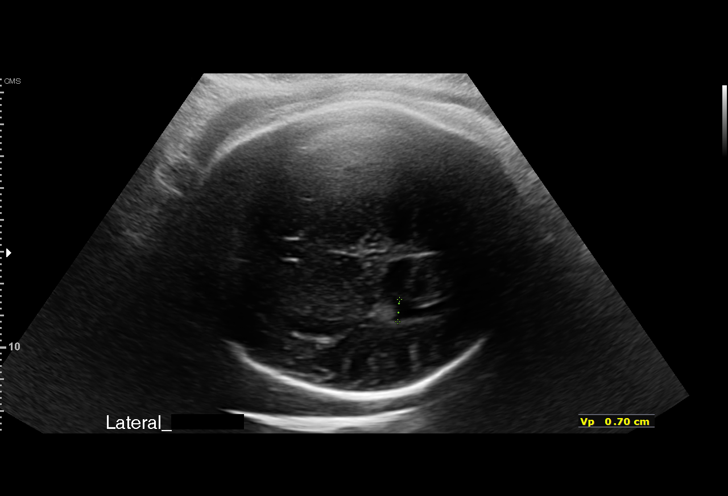
[im 22/54]
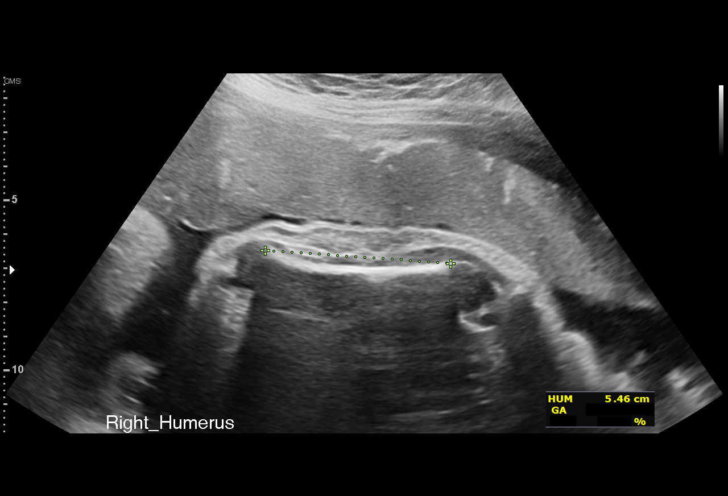
[im 28/54]
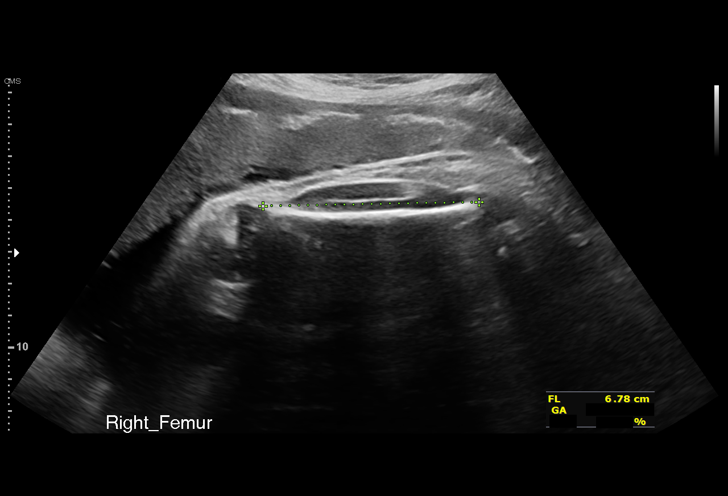
[im 32/54]
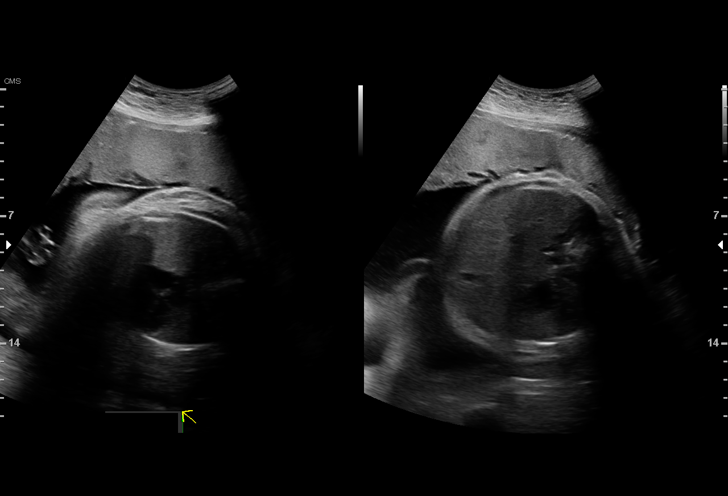
[im 36/54]
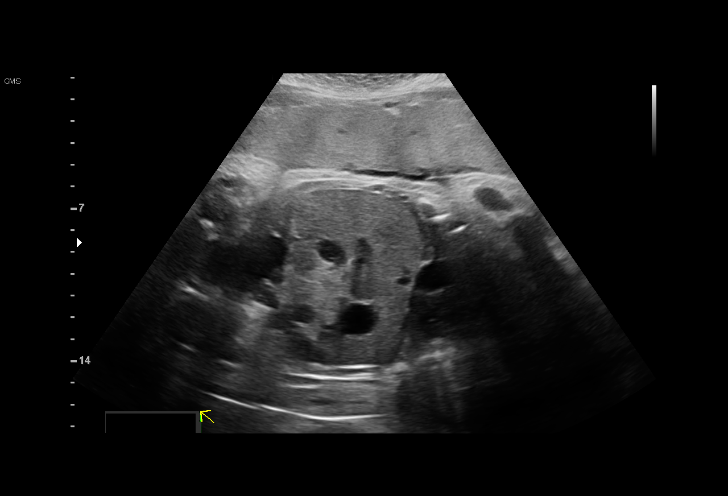
[im 40/54]
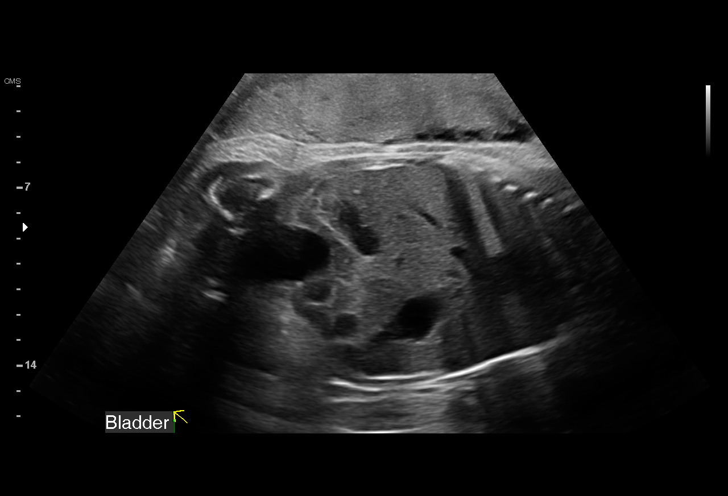
[im 44/54]
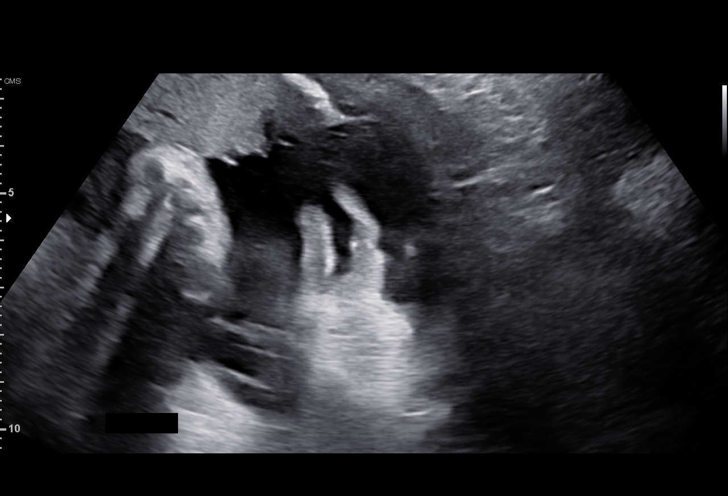
[im 48/54]
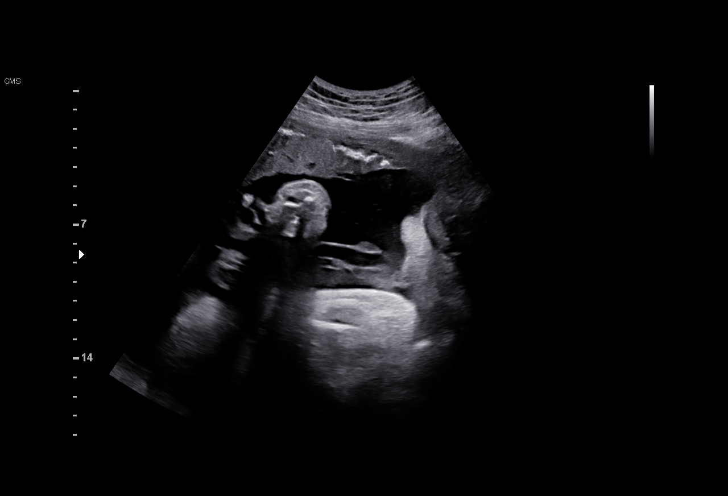
[im 52/54]
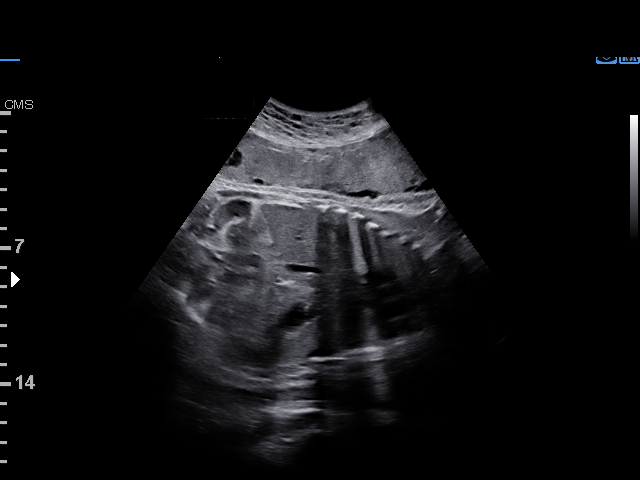

[13 of 28 positions shown; findings below may reference images not displayed]

Name:       ODDVAR AMBLE Juu                  Visit Date: 07/15/2021 [DATE]


Indications

 Gestational diabetes in pregnancy, insulin
 controlled
 Advanced maternal age multigravida 35+,
 second trimester
 Obesity complicating pregnancy, second
 trimester
 34 weeks gestation of pregnancy
 Encounter for antenatal screening for
 malformations
Fetal Evaluation

 Num Of Fetuses:         1
 Fetal Heart Rate(bpm):  162
 Cardiac Activity:       Observed
 Presentation:           Cephalic
 Placenta:               Anterior
 P. Cord Insertion:      Not well visualized

 Amniotic Fluid
 AFI FV:      Within normal limits

 AFI Sum(cm)     %Tile       Largest Pocket(cm)
 18.98           71

 RUQ(cm)       RLQ(cm)       LUQ(cm)        LLQ(cm)

Biophysical Evaluation

 Amniotic F.V:   Within normal limits       F. Tone:        Observed
 F. Movement:    Observed                   Score:          [DATE]
 F. Breathing:   Observed
Biometry

 BPD:      91.3  mm     G. Age:  37w 0d         95  %    CI:         78.6   %    70 - 86
                                                         FL/HC:      20.4   %    20.1 -
 HC:      325.7  mm     G. Age:  36w 6d         68  %    HC/AC:      0.98        0.93 -
 AC:       334   mm     G. Age:  37w 2d         98  %    FL/BPD:     72.7   %    71 - 87
 FL:       66.4  mm     G. Age:  34w 1d         25  %    FL/AC:      19.9   %    20 - 24
 HUM:      53.9  mm     G. Age:  31w 2d        < 5  %
 CER:      47.8  mm     G. Age:  36w 0d         59  %
 LV:          7  mm
 CM:        9.1  mm

 Est. FW:    6582  gm      6 lb 8 oz     88  %
OB History

 Gravidity:    2         Term:   0
 TOP:          1
Gestational Age

 LMP:           34w 6d        Date:  11/13/20                 EDD:   08/20/21
 U/S Today:     36w 2d                                        EDD:   08/10/21
 Best:          34w 6d     Det. By:  LMP  (11/13/20)          EDD:   08/20/21
Anatomy

 Cranium:               Appears normal         LVOT:                   Not well visualized
 Cavum:                 Appears normal         Aortic Arch:            Not well visualized
 Ventricles:            Appears normal         Ductal Arch:            Not well visualized
 Choroid Plexus:        Appears normal         Diaphragm:              Appears normal
 Cerebellum:            Appears normal         Stomach:                Appears normal, left
                                                                       sided
 Posterior Fossa:       Appears normal         Abdomen:                Appears normal
 Nuchal Fold:           Not applicable (>20    Abdominal Wall:         Not well visualized
                        wks GA)
 Face:                  Not well visualized    Cord Vessels:           Appears normal (3
                                                                       vessel cord)
 Lips:                  Appears normal         Kidneys:                Appear normal
 Palate:                Not well visualized    Bladder:                Appears normal
 Thoracic:              Appears normal         Spine:                  Appears normal
 Heart:                 Not well visualized    Upper Extremities:      RT upper ext
                                                                       visualized
 RVOT:                  Not well visualized    Lower Extremities:      RT lower ext
                                                                       visualized

 Other:  Technically difficult due to advanced GA and fetal position.
Cervix Uterus Adnexa
 Cervix
 Not visualized (advanced GA >75wks)

 Uterus
 No abnormality visualized.

 Right Ovary
 Not visualized.

 Left Ovary
 Not visualized.

 Adnexa
 No adnexal mass visualized.
Comments

 This patient was seen for an ultrasound exam due to recently
 diagnosed gestational diabetes that is treated with insulin.
 The patient reports that her fasting fingerstick values have
 been in the 104-110 range.  She denies any other problems
 in her current pregnancy and reports feeling fetal movements
 throughout the day.
 She was informed that the fetal growth and amniotic fluid
 level appears appropriate for her gestational age.
 The views of the fetal anatomy were limited today due to her
 advanced gestational age.
 A BPP performed today was [DATE].
 The implications and management of diabetes in pregnancy
 was discussed in detail with the patient. She was advised that
 our goals for her fingerstick values are fasting values of 90-95
 or less and two-hour postprandials of 120 or less.  She was
 advised that her evening dose of insulin may have to be
 increased to lower her fasting fingerstick values down to the
 normal range.
 The patient reports that she is already scheduled for weekly
 fetal testing in your office.  She should continue weekly fetal
 testing until delivery.
 Should her glucose control continue to be poor, delivery may
 be considered at 37 weeks.  If her fingerstick values become
 normalized, delivery may be considered at 39 weeks.
 No further exams were scheduled in our office.

## 2023-11-26 NOTE — ED Provider Notes (Signed)
 [facility]    CSN: [id number] Arrival date & time: 11/26/23  1736      History   Chief Complaint Chief Complaint  Patient presents with   Urinary Tract Infection   Diarrhea   Flank Pain         HPI Jennifer Pearson is a 38 y.o. female.   HPI  38 year old female with past medical history significant for eosinophilic asthma BV and diarrhea presents for evaluation of diarrhea, right flank pain, and loss of appetite that started 3 days ago.  She reports that today she did start to develop some burning with urination and she also endorses some epigastric burning sensation that comes and goes.  She denies any fever, urinary urgency or frequency, blood in her urine or stool, nausea, or vomiting.  No history of kidney stones.  She denies any vaginal itching or discharge.  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   BRCA neg   COVID 10/2019   Eosinophilic asthma    Gestational diabetes     Patient Active Problem List   Diagnosis Date Noted   BV (bacterial vaginosis) 03/28/2023   Diarrhea 03/28/2023   Admission for sterilization 10/21/2022   Encounter for care or examination of lactating mother 08/04/2021   Postpartum care following cesarean delivery 08/04/2021   Status post primary low transverse cesarean section 08/04/2021    Past Surgical History:  Procedure Laterality Date   CESAREAN SECTION  08/02/2021   Procedure: CESAREAN SECTION;  Surgeon: Alben Alma, MD;  Location: [facility];  Service: Obstetrics;;   COLPOSCOPY  2018   ROBOTIC ASSISTED BILATERAL SALPINGO OOPHERECTOMY Bilateral 10/21/2022   Procedure: XI ROBOTIC ASSISTED BILATERAL SALPINGECTOMY;  Surgeon: Kris Pester, MD;  Location: [facility];  Service: Gynecology;  Laterality: Bilateral;    OB History     Gravida  2   Para  1   Term  1   Preterm  0   AB  1   Living  1      SAB  0   IAB  1   Ectopic  0   Multiple  0   Live Births  1             Home Medications    Prior to Admission medications   Medication Sig Start Date End Date Taking? Authorizing Provider  omalizumab Anitra Ket) 150 MG/ML prefilled syringe Inject 150 mg into the skin every 14 (fourteen) days.   Yes [provider]  pantoprazole (PROTONIX) 20 MG tablet Take 20 mg by mouth. 03/31/23 03/30/24 Yes [provider]  Anitra Ket 150 MG/ML prefilled syringe Inject 150 mg into the skin every 14 (fourteen) days. 01/01/21  Yes [provider]  XOLAIR 75 MG/0.5ML prefilled syringe Inject 75 mg into the skin every 14 (fourteen) days. 11/27/20  Yes [provider]  Collagen Hydrolysate POWD by Does not apply route.    [provider]  EPINEPHrine  0.3 mg/0.3 mL IJ SOAJ injection Inject 0.3 mg into the muscle as needed for anaphylaxis.   Yes [provider]  HYDROcodone -acetaminophen  (NORCO/VICODIN) 5-325 MG tablet Take 1 tablet by mouth every 6 (six) hours as needed (breakthrough pain). 10/21/22   Kris Pester, MD  ibuprofen  (ADVIL ) 600 MG tablet Take 1 tablet (600 mg total) by mouth every 6 (six) hours. 10/21/22  Yes Kris Pester, MD  metroNIDAZOLE  (FLAGYL ) 500 MG tablet Take 1 tablet (500 mg total) by mouth 2 (two) times  daily. 03/28/23   Defelice, Eveleen Hinds, NP  ondansetron  (ZOFRAN -ODT) 4 MG disintegrating tablet Take 1 tablet (4 mg total) by mouth every 6 (six) hours as needed for nausea. 10/21/22   Kris Pester, MD  sertraline (ZOLOFT) 100 MG tablet Take 100 mg by mouth at bedtime.    [provider]    Family History Family History  Problem Relation Age of Onset   Heart Problems Mother    Hypertension Father    Breast cancer Maternal Grandmother 34   Hypertension Maternal Grandmother    Hypertension Maternal Grandfather    Breast cancer Paternal Grandmother 3   Hypertension Paternal Grandmother    Diabetes Paternal Grandfather    Lung cancer Paternal Grandfather 5   Hypertension Paternal  Grandfather     Social History Social History   Tobacco Use   Smoking status: Never   Smokeless tobacco: Never  Vaping Use   Vaping status: Never Used  Substance Use Topics   Alcohol use: Yes    Alcohol/week: 2.0 standard drinks of alcohol    Types: 1 Glasses of wine, 1 Cans of beer per week    Comment: occassional   Drug use: No     Allergies   Patient has no known allergies.   Review of Systems Review of Systems  Constitutional:  Positive for appetite change. Negative for fever.  Gastrointestinal:  Positive for diarrhea. Negative for abdominal pain, nausea and vomiting.  Genitourinary:  Positive for dysuria and flank pain. Negative for frequency, hematuria, urgency, vaginal bleeding, vaginal discharge and vaginal pain.  Musculoskeletal:  Positive for back pain.  Skin:  Negative for rash.     Physical Exam Triage Vital Signs ED Triage Vitals  Encounter Vitals Group     BP      Systolic BP Percentile      Diastolic BP Percentile      Pulse      Resp      Temp      Temp src      SpO2      Weight      Height      Head Circumference      Peak Flow      Pain Score      Pain Loc      Pain Education      Exclude from Growth Chart    No data found.  Updated Vital Signs BP 112/69 (BP Location: Left Arm)   Pulse 95   Temp 98.6 F (37 C) (Oral)   Ht 5\' 5"  (1.651 m)   Wt 160 lb (72.6 kg)   LMP 11/12/2023   SpO2 99%   BMI 26.63 kg/m   Visual Acuity Right Eye Distance:   Left Eye Distance:   Bilateral Distance:    Right Eye Near:   Left Eye Near:    Bilateral Near:     Physical Exam Vitals and nursing note reviewed.  Constitutional:      Appearance: Normal appearance. She is not ill-appearing.  HENT:     Head: Normocephalic and atraumatic.  Cardiovascular:     Rate and Rhythm: Normal rate and regular rhythm.     Pulses: Normal pulses.     Heart sounds: Normal heart sounds. No murmur heard.    No friction rub. No gallop.  Pulmonary:      Effort: Pulmonary effort is normal.     Breath sounds: Normal breath sounds. No wheezing, rhonchi or rales.  Abdominal:  General: Abdomen is flat.     Palpations: Abdomen is soft.     Tenderness: There is abdominal tenderness. There is no right CVA tenderness, left CVA tenderness, guarding or rebound.     Comments: Mild suprapubic tenderness.  Skin:    General: Skin is warm and dry.     Capillary Refill: Capillary refill takes less than 2 seconds.  Neurological:     General: No focal deficit present.     Mental Status: She is alert and oriented to person, place, and time.      UC Treatments / Results  Labs (all labs ordered are listed, but only abnormal results are displayed) Labs Reviewed  URINALYSIS, W/ REFLEX TO CULTURE (INFECTION SUSPECTED) - Abnormal; Notable for the following components:      Result Value   Hgb urine dipstick TRACE (*)    Ketones, ur TRACE (*)    Bacteria, UA FEW (*)    All other components within normal limits  CERVICOVAGINAL ANCILLARY ONLY    EKG   Radiology No results found.  Procedures Procedures (including critical care time)  Medications Ordered in UC Medications - No data to display  Initial Impression / Assessment and Plan / UC Course  I have reviewed the triage vital signs and the nursing notes.  Pertinent labs & imaging results that were available during my care of the patient were reviewed by me and considered in my medical decision making (see chart for details).   Patient is a pleasant, nontoxic-appearing 38 year old female presenting for evaluation of gastrointestinal genitourinary symptoms outlined HPI above.  On exam patient is not in any acute distress.  She has no CVA tenderness on exam.  Abdomen is soft, flat, with mild suprapubic tenderness but no guarding or rebound.  The patient reports that she has had right flank pain with decreased appetite and diarrhea for the last 3 days and did not develop dysuria until today.  She  does have a history of diarrhea and also recurrent BV but she denies any vaginal discharge or itching at present.  I will order urinalysis to evaluate for the presence of UTI.  If her urine is negative I will collect a vaginal cytology swab to evaluate for the presence of BV or yeast.  Urinalysis shows trace hemoglobin and trace ketones but negative for leukocyte esterase, nitrates, or protein.  Also no glucose.  Reflex microscopy shows skin contamination with 6-10 squamous epithelials but 0-5 WBCs, 0-5 RBCs, few bacteria, and mucus present.  I will order a vaginal cytology swab to evaluate for possible BV and yeast as BV can on occasion cause diarrhea.  I will prescribe Pyridium that the patient can use every 8 hours as needed for urinary discomfort and will encourage her to increase oral fluid intake to increase her urine production and flush her urinary tract.  She can use over-the-counter Imodium according the package instructions as needed for diarrhea.  If she develops any abdominal pain, nausea or vomiting where she cannot keep down fluids, fever, or blood in her stool she needs to go to the ER for evaluation.   Final Clinical Impressions(s) / UC Diagnoses   Final diagnoses:  Dysuria  Flank pain  Diarrhea, unspecified type     Discharge Instructions      Your urinalysis did not show any evidence of urinary tract infection.  We have collected a vaginal swab and also to assess for BV which can on occasion cause back pain and diarrhea.  Use over-the-counter  Tylenol  and/or ibuprofen  according the package directions as needed for any pain.  Use the Pyridium every 8 hours as needed for urinary discomfort.  Continue to orally rehydrate using Pedialyte, broth, and water.  Advance your diet as tolerated.  You may use over-the-counter Imodium as needed for management of your diarrhea symptoms.  If you develop any abdominal pain, nausea and vomiting or you cannot keep down fluids, fever,  or start passing blood in your stool you need to go to the ER for evaluation.     ED Prescriptions   None    PDMP not reviewed this encounter.   Kent Pear, NP 11/26/23 [phone]

## 2023-11-26 NOTE — ED Triage Notes (Signed)
 Pt c/o diarrhea, flank pain, and loss of appetite x3days  Pt states that it is now feels like it will burn when she pees but she has not been able to due to loss of appetite.   Pt denies vaginal odor or vaginal discharge

## 2023-11-26 NOTE — Discharge Instructions (Addendum)
 Your urinalysis did not show any evidence of urinary tract infection.  We have collected a vaginal swab and also to assess for BV which can on occasion cause back pain and diarrhea.  Use over-the-counter Tylenol  and/or ibuprofen  according the package directions as needed for any pain.  Use the Pyridium every 8 hours as needed for urinary discomfort.  Continue to orally rehydrate using Pedialyte, broth, and water.  Advance your diet as tolerated.  You may use over-the-counter Imodium as needed for management of your diarrhea symptoms.  If you develop any abdominal pain, nausea and vomiting or you cannot keep down fluids, fever, or start passing blood in your stool you need to go to the ER for evaluation.

## 2023-11-29 LAB — CERVICOVAGINAL ANCILLARY ONLY
Bacterial Vaginitis (gardnerella): NEGATIVE
Candida Glabrata: NEGATIVE
Candida Vaginitis: NEGATIVE
Comment: NEGATIVE
Comment: NEGATIVE
Comment: NEGATIVE
